# Patient Record
Sex: Male | Born: 1975
Health system: Southern US, Community
[De-identification: ages and names within clinical notes are randomized; demographics above are authoritative.]

## PROBLEM LIST (undated history)

## (undated) ENCOUNTER — Ambulatory Visit (HOSPITAL_COMMUNITY): Admission: EM | Payer: 59 | Source: Home / Self Care

## (undated) HISTORY — PX: MOUTH SURGERY: SHX715

---
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
## 1999-09-22 ENCOUNTER — Emergency Department (HOSPITAL_COMMUNITY): Admission: EM | Admit: 1999-09-22 | Discharge: 1999-09-22 | Payer: Self-pay | Admitting: Emergency Medicine

## 2002-04-24 ENCOUNTER — Encounter: Payer: Self-pay | Admitting: Emergency Medicine

## 2002-04-24 ENCOUNTER — Emergency Department (HOSPITAL_COMMUNITY): Admission: EM | Admit: 2002-04-24 | Discharge: 2002-04-24 | Payer: Self-pay | Admitting: Emergency Medicine

## 2002-07-17 ENCOUNTER — Emergency Department (HOSPITAL_COMMUNITY): Admission: EM | Admit: 2002-07-17 | Discharge: 2002-07-17 | Payer: Self-pay | Admitting: Emergency Medicine

## 2005-06-14 ENCOUNTER — Emergency Department (HOSPITAL_COMMUNITY): Admission: EM | Admit: 2005-06-14 | Discharge: 2005-06-14 | Payer: Self-pay | Admitting: Family Medicine

## 2007-12-11 ENCOUNTER — Emergency Department (HOSPITAL_COMMUNITY): Admission: EM | Admit: 2007-12-11 | Discharge: 2007-12-11 | Payer: Self-pay | Admitting: Emergency Medicine

## 2008-08-10 ENCOUNTER — Emergency Department (HOSPITAL_COMMUNITY): Admission: EM | Admit: 2008-08-10 | Discharge: 2008-08-10 | Payer: Self-pay | Admitting: Family Medicine

## 2009-05-05 ENCOUNTER — Emergency Department (HOSPITAL_COMMUNITY): Admission: EM | Admit: 2009-05-05 | Discharge: 2009-05-05 | Payer: Self-pay | Admitting: Family Medicine

## 2009-12-01 ENCOUNTER — Emergency Department (HOSPITAL_COMMUNITY): Admission: EM | Admit: 2009-12-01 | Discharge: 2009-12-01 | Payer: Self-pay | Admitting: Family Medicine

## 2010-03-31 ENCOUNTER — Emergency Department (HOSPITAL_COMMUNITY)
Admission: EM | Admit: 2010-03-31 | Discharge: 2010-03-31 | Payer: Self-pay | Source: Home / Self Care | Admitting: Family Medicine

## 2010-06-21 LAB — GIARDIA/CRYPTOSPORIDIUM SCREEN(EIA)
Cryptosporidium Screen (EIA): NEGATIVE
Giardia Screen - EIA: NEGATIVE

## 2010-07-11 LAB — GC/CHLAMYDIA PROBE AMP, GENITAL
Chlamydia, DNA Probe: NEGATIVE
GC Probe Amp, Genital: NEGATIVE

## 2011-01-03 LAB — GC/CHLAMYDIA PROBE AMP, GENITAL
Chlamydia, DNA Probe: NEGATIVE
GC Probe Amp, Genital: NEGATIVE

## 2011-06-22 ENCOUNTER — Emergency Department (HOSPITAL_COMMUNITY)
Admission: EM | Admit: 2011-06-22 | Discharge: 2011-06-22 | Disposition: A | Discharge status: Home / Self Care | Payer: Self-pay | Source: Home / Self Care | Attending: Emergency Medicine | Admitting: Emergency Medicine

## 2011-06-22 ENCOUNTER — Encounter (HOSPITAL_COMMUNITY): Payer: Self-pay

## 2011-06-22 DIAGNOSIS — IMO0002 Reserved for concepts with insufficient information to code with codable children: Secondary | ICD-10-CM

## 2011-06-22 MED ORDER — DOXYCYCLINE HYCLATE 100 MG PO CAPS: 100.0000 mg | ORAL_CAPSULE | Freq: Two times a day (BID) | ORAL | Status: AC

## 2011-06-22 NOTE — ED Provider Notes (Signed)
History     CSN: [id number]  Arrival date & time 06/22/11  1328   First MD Initiated Contact with Patient 06/22/11 1401      Chief Complaint  Patient presents with  . Toe Pain    (Consider location/radiation/quality/duration/timing/severity/associated sxs/prior treatment) HPI Comments: Patient presents urgent care with infection to both great toenails that have been getting worse in the last 2 days he relates that he cut his great toenails about 3 days ago sustaining clean warm ascites and clean it and debriding the area with pressors. Have been soaking his feet in Epsom salts frequently in both areas on the skin of both hands are swollen red and draining  Patient is a 36 y.o. male presenting with toe pain. The history is provided by the patient.  Toe Pain This is a new problem. The current episode started more than 2 days ago. The problem occurs constantly. The symptoms are aggravated by bending and walking. The symptoms are relieved by rest. He has tried nothing for the symptoms.    History reviewed. No pertinent past medical history.  History reviewed. No pertinent past surgical history.  No family history on file.  History  Substance Use Topics  . Smoking status: Current Everyday Smoker -- 0.5 packs/day  . Smokeless tobacco: Not on file  . Alcohol Use: Yes      Review of Systems  Constitutional: Negative for fever, chills and fatigue.  Skin: Positive for rash and wound.    Allergies  Review of patient's allergies indicates no known allergies.  Home Medications   Current Outpatient Rx  Name Route Sig Dispense Refill  . DOXYCYCLINE HYCLATE 100 MG PO CAPS Oral Take 1 capsule (100 mg total) by mouth 2 (two) times daily. 20 capsule 0    BP 128/81  Pulse 88  Temp(Src) 98.9 F (37.2 C) (Oral)  Resp 18  SpO2 96%  Physical Exam  Nursing note and vitals reviewed. Constitutional: He appears well-developed and well-nourished.  Musculoskeletal: He exhibits edema  and tenderness.       Feet:  Skin: Skin is warm. There is erythema.    ED Course  Procedures (including critical care time)  Labs Reviewed - No data to display No results found.   1. Paronychia       MDM  Posttraumatic bilateral great toe paronychia is. Spontaneously draining        Jimmie Molly, MD 06/22/11 [phone]

## 2011-06-22 NOTE — ED Notes (Signed)
Pt states he cut his great toenails 3 days ago and then cleaned around/under them with tweezers.  States now the skin around both nails are swollen, red, draining and sore.

## 2012-02-14 ENCOUNTER — Telehealth: Payer: Self-pay | Admitting: Nurse Practitioner

## 2012-02-14 NOTE — Telephone Encounter (Signed)
Error

## 2012-04-02 ENCOUNTER — Emergency Department (HOSPITAL_COMMUNITY)
Admission: EM | Admit: 2012-04-02 | Discharge: 2012-04-02 | Disposition: A | Discharge status: Home / Self Care | Payer: 59 | Source: Home / Self Care | Attending: Family Medicine | Admitting: Family Medicine

## 2012-04-02 ENCOUNTER — Encounter (HOSPITAL_COMMUNITY): Payer: Self-pay | Admitting: *Deleted

## 2012-04-02 DIAGNOSIS — J069 Acute upper respiratory infection, unspecified: Secondary | ICD-10-CM

## 2012-04-02 LAB — POCT RAPID STREP A: Streptococcus, Group A Screen (Direct): NEGATIVE

## 2012-04-02 NOTE — ED Notes (Signed)
Pt  Had  A  Fever  sev  Days  Ago  He  Reports   Body  Aches   And      Low  Grade  Fever  As  Well         At this  Time  Pt is  Sitting  Upright on  Exam table   Speaking in  Complete  sentances      Skin is  Warm and  dry

## 2012-04-02 NOTE — ED Provider Notes (Signed)
History     CSN: [id number]  Arrival date & time 04/02/12  1134   First MD Initiated Contact with Patient 04/02/12 1214      Chief Complaint  Patient presents with  . Sore Throat    (Consider location/radiation/quality/duration/timing/severity/associated sxs/prior treatment) Patient is a 37 y.o. male presenting with pharyngitis. The history is provided by the patient.  Sore Throat This is a new problem. The current episode started more than 2 days ago. The problem has not changed since onset.Pertinent negatives include no shortness of breath. The symptoms are aggravated by swallowing.    History reviewed. No pertinent past medical history.  History reviewed. No pertinent past surgical history.  No family history on file.  History  Substance Use Topics  . Smoking status: Current Every Day Smoker -- 0.5 packs/day  . Smokeless tobacco: Not on file  . Alcohol Use: Yes      Review of Systems  Constitutional: Positive for fever. Negative for chills and appetite change.  HENT: Positive for sore throat. Negative for congestion, rhinorrhea and postnasal drip.   Respiratory: Negative for cough, shortness of breath and wheezing.   Gastrointestinal: Negative.   Skin: Negative for rash.    Allergies  Review of patient's allergies indicates no known allergies.  Home Medications  No current outpatient prescriptions on file.  BP 122/80  Pulse 72  Temp 99.4 F (37.4 C) (Oral)  Resp 18  SpO2 100%  Physical Exam  Nursing note and vitals reviewed. Constitutional: He is oriented to person, place, and time. He appears well-developed and well-nourished.  HENT:  Head: Normocephalic.  Right Ear: External ear normal.  Left Ear: External ear normal.  Mouth/Throat: Oropharynx is clear and moist.  Eyes: Conjunctivae normal are normal. Pupils are equal, round, and reactive to light.  Neck: Normal range of motion. Neck supple.  Cardiovascular: Normal rate, normal heart sounds and  intact distal pulses.   Pulmonary/Chest: Effort normal and breath sounds normal.  Abdominal: Soft. Bowel sounds are normal.  Lymphadenopathy:    He has no cervical adenopathy.  Neurological: He is alert and oriented to person, place, and time.  Skin: Skin is warm and dry.    ED Course  Procedures (including critical care time)   Labs Reviewed  POCT RAPID STREP A ([facility] ONLY)   No results found.   1. URI (upper respiratory infection)       MDM  Strep neg.        Linna Hoff, MD 04/02/12 1239

## 2014-03-05 ENCOUNTER — Ambulatory Visit (INDEPENDENT_AMBULATORY_CARE_PROVIDER_SITE_OTHER): Payer: 59 | Admitting: Family

## 2014-03-05 ENCOUNTER — Encounter: Payer: Self-pay | Admitting: Family

## 2014-03-05 DIAGNOSIS — Z0000 Encounter for general adult medical examination without abnormal findings: Secondary | ICD-10-CM | POA: Insufficient documentation

## 2014-03-05 NOTE — Progress Notes (Signed)
Subjective:    Patient ID: Casey Sellers, male    DOB: 09/24/1975, 38 y.o.   MRN: [id number]  Chief Complaint  Patient presents with  . Establish Care    wants to make sure hes in good health   HPI:  Casey Sellers is a 38 y.o. male who presents today for an annual wellness visit and to establish care.    1) Health Maintenance -   Diet - Does not eat a lot of fried foods. Bakes food.  Exercise - Getting ready to start  2) Preventative Exams / Immunizations:  Dental -- Due for exam Vision -- Due for exam   Health Maintenance  Topic Date Due  . INFLUENZA VACCINE  11/01/2014  . TETANUS/TDAP  12/31/2020    There is no immunization history on file for this patient.  No past medical history on file.  Past Surgical History  Procedure Laterality Date  . Knee surgery  1995    Cartiledge repair   Family History  Problem Relation Age of Onset  . Hypertension Mother   . Diabetes Mother   . Diabetes Father    History   Social History  . Marital Status: Divorced    Spouse Name: N/A    Number of Children: 3  . Years of Education: 12   Occupational History  . [employer]    Social History Main Topics  . Smoking status: Current Every Day Smoker -- 0.50 packs/day for 25 years  . Smokeless tobacco: Never Used  . Alcohol Use: Yes     Comment: 1/2 pint a day  . Drug Use: No  . Sexual Activity: Not on file   Other Topics Concern  . Not on file   Social History Narrative   [place] Currently resides in a house with his son, finace and step-son. Fish. Fun: hang out at the house.   Denies any religious beliefs that would effect health care.     Review of Systems  Constitutional: Denies fever, chills, fatigue, or significant weight gain/loss. HENT: Head: Denies headache or neck pain Ears: Denies changes in hearing, ringing in ears, earache, drainage Nose: Denies discharge, stuffiness, itching, nosebleed, sinus pain Throat:  Denies sore throat, hoarseness, dry mouth, sores, thrush Eyes: Denies loss/changes in vision, pain, redness, blurry/double vision, flashing lights Cardiovascular: Denies chest pain/discomfort, tightness, palpitations, shortness of breath with activity, difficulty lying down, swelling, sudden awakening with shortness of breath Respiratory: Denies shortness of breath, cough, sputum production, wheezing Gastrointestinal: Denies dysphasia, heartburn, change in appetite, nausea, change in bowel habits, rectal bleeding, constipation, diarrhea, yellow skin or eyes Genitourinary: Denies frequency, urgency, burning/pain, blood in urine, incontinence, change in urinary strength. Musculoskeletal: Denies muscle/joint pain, stiffness, back pain, redness or swelling of joints, trauma Skin: Denies rashes, lumps, itching, dryness, color changes, or hair/nail changes Neurological: Denies dizziness, fainting, seizures, weakness, numbness, tingling, tremor Psychiatric - Denies nervousness, stress, depression or memory loss Endocrine: Denies heat or cold intolerance, sweating, frequent urination, excessive thirst, changes in appetite Hematologic: Denies ease of bruising or bleeding    Objective:    BP 148/80 mmHg  Pulse 96  Temp(Src) 98.3 F (36.8 C) (Oral)  Resp 18  Ht 5\' 5"  (1.651 m)  Wt 159 lb 6.4 oz (72.303 kg)  BMI 26.53 kg/m2  SpO2 97% Nursing note and vital signs reviewed.  Physical Exam  Constitutional: He is oriented to person, place, and time. He appears well-developed and well-nourished.  HENT:  Head: Normocephalic.  Right Ear: Hearing, tympanic membrane, external ear and ear canal normal.  Left Ear: Hearing, tympanic membrane, external ear and ear canal normal.  Nose: Nose normal.  Mouth/Throat: Uvula is midline, oropharynx is clear and moist and mucous membranes are normal.  Eyes: Conjunctivae and EOM are normal. Pupils are equal, round, and reactive to light.  Neck: Neck supple. No JVD  present. No tracheal deviation present. No thyromegaly present.  Cardiovascular: Normal rate, regular rhythm, normal heart sounds and intact distal pulses.   Pulmonary/Chest: Effort normal and breath sounds normal.  Abdominal: Soft. Bowel sounds are normal. He exhibits no distension and no mass. There is no tenderness. There is no rebound and no guarding.  Musculoskeletal: Normal range of motion. He exhibits no edema or tenderness.  Lymphadenopathy:    He has no cervical adenopathy.  Neurological: He is alert and oriented to person, place, and time. He has normal reflexes. No cranial nerve deficit. He exhibits normal muscle tone. Coordination normal.  Skin: Skin is warm and dry.  Psychiatric: He has a normal mood and affect. His behavior is normal. Judgment and thought content normal.       Assessment & Plan:

## 2014-03-05 NOTE — Assessment & Plan Note (Signed)
1) Anticipatory Guidance: Discussed importance of wearing a seatbelt while driving and not texting while driving; changing batteries in smoke detector at least once annually; wearing suntan lotion when outside; eating a balanced and moderate diet; getting physical activity at least 30 minutes per day.  2) Immunizations / Screenings / Labs:  All immunizations are up-to-date. He is due for both dental and visual screens. All other screenings are up-to-date. Obtain CBC, BMET, Lipid profile and TSH when fasting.  Over all well exam. Has several risk factors including smoking and alcohol use. Discuss decreasing both smoking and alcohol use. He will be starting an exercise program. Follow up prevention exam in one year or sooner pending lab work.

## 2014-03-05 NOTE — Patient Instructions (Signed)
Thank you for choosing [facility].  Summary/Instructions:  Please stop by the lab on the basement level of the building for your blood work. Your results will be released to MyChart (or called to you) after review, usually within 72 hours after test completion. If any changes need to be made, you will be notified at that same time.  If your symptoms worsen or fail to improve, please contact our office for further instruction, or in case of emergency go directly to the emergency room at the closest medical facility.   Health Maintenance A healthy lifestyle and preventative care can promote health and wellness.  Maintain regular health, dental, and eye exams.  Eat a healthy diet. Foods like vegetables, fruits, whole grains, low-fat dairy products, and lean protein foods contain the nutrients you need and are low in calories. Decrease your intake of foods high in solid fats, added sugars, and salt. Get information about a proper diet from your health care provider, if necessary.  Regular physical exercise is one of the most important things you can do for your health. Most adults should get at least 150 minutes of moderate-intensity exercise (any activity that increases your heart rate and causes you to sweat) each week. In addition, most adults need muscle-strengthening exercises on 2 or more days a week.   Maintain a healthy weight. The body mass index (BMI) is a screening tool to identify possible weight problems. It provides an estimate of body fat based on height and weight. Your health care provider can find your BMI and can help you achieve or maintain a healthy weight. For males 20 years and older:  A BMI below 18.5 is considered underweight.  A BMI of 18.5 to 24.9 is normal.  A BMI of 25 to 29.9 is considered overweight.  A BMI of 30 and above is considered obese.  Maintain normal blood lipids and cholesterol by exercising and minimizing your intake of saturated fat. Eat a  balanced diet with plenty of fruits and vegetables. Blood tests for lipids and cholesterol should begin at age 20 and be repeated every 5 years. If your lipid or cholesterol levels are high, you are over age 50, or you are at high risk for heart disease, you may need your cholesterol levels checked more frequently.Ongoing high lipid and cholesterol levels should be treated with medicines if diet and exercise are not working.  If you smoke, find out from your health care provider how to quit. If you do not use tobacco, do not start.  Lung cancer screening is recommended for adults aged 55-80 years who are at high risk for developing lung cancer because of a history of smoking. A yearly low-dose CT scan of the lungs is recommended for people who have at least a 30-pack-year history of smoking and are current smokers or have quit within the past 15 years. A pack year of smoking is smoking an average of 1 pack of cigarettes a day for 1 year (for example, a 30-pack-year history of smoking could mean smoking 1 pack a day for 30 years or 2 packs a day for 15 years). Yearly screening should continue until the smoker has stopped smoking for at least 15 years. Yearly screening should be stopped for people who develop a health problem that would prevent them from having lung cancer treatment.  If you choose to drink alcohol, do not have more than 2 drinks per day. One drink is considered to be 12 oz (360 mL) of beer,   5 oz (150 mL) of wine, or 1.5 oz (45 mL) of liquor.  Avoid the use of street drugs. Do not share needles with anyone. Ask for help if you need support or instructions about stopping the use of drugs.  High blood pressure causes heart disease and increases the risk of stroke. Blood pressure should be checked at least every 1-2 years. Ongoing high blood pressure should be treated with medicines if weight loss and exercise are not effective.  If you are 45-79 years old, ask your health care provider if  you should take aspirin to prevent heart disease.  Diabetes screening involves taking a blood sample to check your fasting blood sugar level. This should be done once every 3 years after age 45 if you are at a normal weight and without risk factors for diabetes. Testing should be considered at a younger age or be carried out more frequently if you are overweight and have at least 1 risk factor for diabetes.  Colorectal cancer can be detected and often prevented. Most routine colorectal cancer screening begins at the age of 50 and continues through age 75. However, your health care provider may recommend screening at an earlier age if you have risk factors for colon cancer. On a yearly basis, your health care provider may provide home test kits to check for hidden blood in the stool. A small camera at the end of a tube may be used to directly examine the colon (sigmoidoscopy or colonoscopy) to detect the earliest forms of colorectal cancer. Talk to your health care provider about this at age 50 when routine screening begins. A direct exam of the colon should be repeated every 5-10 years through age 75, unless early forms of precancerous polyps or small growths are found.  People who are at an increased risk for hepatitis B should be screened for this virus. You are considered at high risk for hepatitis B if:  You were born in a country where hepatitis B occurs often. Talk with your health care provider about which countries are considered high risk.  Your parents were born in a high-risk country and you have not received a shot to protect against hepatitis B (hepatitis B vaccine).  You have HIV or AIDS.  You use needles to inject street drugs.  You live with, or have sex with, someone who has hepatitis B.  You are a man who has sex with other men (MSM).  You get hemodialysis treatment.  You take certain medicines for conditions like cancer, organ transplantation, and autoimmune  conditions.  Hepatitis C blood testing is recommended for all people born from 1945 through 1965 and any individual with known risk factors for hepatitis C.  Healthy men should no longer receive prostate-specific antigen (PSA) blood tests as part of routine cancer screening. Talk to your health care provider about prostate cancer screening.  Testicular cancer screening is not recommended for adolescents or adult males who have no symptoms. Screening includes self-exam, a health care provider exam, and other screening tests. Consult with your health care provider about any symptoms you have or any concerns you have about testicular cancer.  Practice safe sex. Use condoms and avoid high-risk sexual practices to reduce the spread of sexually transmitted infections (STIs).  You should be screened for STIs, including gonorrhea and chlamydia if:  You are sexually active and are younger than 24 years.  You are older than 24 years, and your health care provider tells you that you are at   risk for this type of infection.  Your sexual activity has changed since you were last screened, and you are at an increased risk for chlamydia or gonorrhea. Ask your health care provider if you are at risk.  If you are at risk of being infected with HIV, it is recommended that you take a prescription medicine daily to prevent HIV infection. This is called pre-exposure prophylaxis (PrEP). You are considered at risk if:  You are a man who has sex with other men (MSM).  You are a heterosexual man who is sexually active with multiple partners.  You take drugs by injection.  You are sexually active with a partner who has HIV.  Talk with your health care provider about whether you are at high risk of being infected with HIV. If you choose to begin PrEP, you should first be tested for HIV. You should then be tested every 3 months for as long as you are taking PrEP.  Use sunscreen. Apply sunscreen liberally and  repeatedly throughout the day. You should seek shade when your shadow is shorter than you. Protect yourself by wearing long sleeves, pants, a wide-brimmed hat, and sunglasses year round whenever you are outdoors.  Tell your health care provider of new moles or changes in moles, especially if there is a change in shape or color. Also, tell your health care provider if a mole is larger than the size of a pencil eraser.  A one-time screening for abdominal aortic aneurysm (AAA) and surgical repair of large AAAs by ultrasound is recommended for men aged 65-75 years who are current or former smokers.  Stay current with your vaccines (immunizations). Document Released: 09/15/2007 Document Revised: 03/24/2013 Document Reviewed: 08/14/2010 Exit[employer] Patient Information 2015 [employer]. This information is not intended to replace advice given to you by your health care provider. Make sure you discuss any questions you have with your health care provider.  

## 2014-03-05 NOTE — Progress Notes (Signed)
Pre visit review using our clinic review tool, if applicable. No additional management support is needed unless otherwise documented below in the visit note. 

## 2014-03-08 ENCOUNTER — Telehealth: Payer: Self-pay | Admitting: Family

## 2014-03-08 ENCOUNTER — Encounter: Payer: Self-pay | Admitting: Family

## 2014-03-08 ENCOUNTER — Other Ambulatory Visit (INDEPENDENT_AMBULATORY_CARE_PROVIDER_SITE_OTHER): Payer: 59

## 2014-03-08 DIAGNOSIS — Z0000 Encounter for general adult medical examination without abnormal findings: Secondary | ICD-10-CM

## 2014-03-08 DIAGNOSIS — R790 Abnormal level of blood mineral: Secondary | ICD-10-CM

## 2014-03-08 LAB — CBC
HEMATOCRIT: 44.7 % (ref 39.0–52.0)
HEMOGLOBIN: 15.1 g/dL (ref 13.0–17.0)
MCHC: 33.7 g/dL (ref 30.0–36.0)
MCV: 96.3 fl (ref 78.0–100.0)
Platelets: 231 10*3/uL (ref 150.0–400.0)
RBC: 4.64 Mil/uL (ref 4.22–5.81)
RDW: 13.1 % (ref 11.5–15.5)
WBC: 7.6 10*3/uL (ref 4.0–10.5)

## 2014-03-08 LAB — LIPID PANEL
CHOL/HDL RATIO: 3
Cholesterol: 196 mg/dL (ref 0–200)
HDL: 57.5 mg/dL (ref >39.00)
NonHDL: 138.5
TRIGLYCERIDES: 257 mg/dL — AB (ref 0.0–149.0)
VLDL: 51.4 mg/dL — AB (ref 0.0–40.0)

## 2014-03-08 LAB — BASIC METABOLIC PANEL
BUN: 11 mg/dL (ref 6–23)
CO2: 25 mEq/L (ref 19–32)
CREATININE: 0.8 mg/dL (ref 0.4–1.5)
Calcium: 9.5 mg/dL (ref 8.4–10.5)
Chloride: 107 mEq/L (ref 96–112)
GFR: 142.2 mL/min (ref >60.00)
GLUCOSE: 101 mg/dL — AB (ref 70–99)
Potassium: 3.9 mEq/L (ref 3.5–5.1)
SODIUM: 139 meq/L (ref 135–145)

## 2014-03-08 LAB — LDL CHOLESTEROL, DIRECT: Direct LDL: 85.3 mg/dL

## 2014-03-08 LAB — TSH: TSH: 1.23 u[IU]/mL (ref 0.35–4.50)

## 2014-03-08 NOTE — Telephone Encounter (Signed)
Pt called in and is requesting a call back with lab result  Best number [phone]

## 2014-03-08 NOTE — Telephone Encounter (Signed)
emmi mailed  °

## 2014-03-09 ENCOUNTER — Telehealth: Payer: Self-pay | Admitting: Family

## 2014-03-09 NOTE — Telephone Encounter (Signed)
Pt aware that labs were good with the exception of tryglicerides being a little high. Encouraged him to increase fruit and vegetable intake while working out most days of the week. Pt understood.

## 2014-03-09 NOTE — Telephone Encounter (Signed)
Results were sent through MyChart:  Your lab results have returned and are mainly within the expected range is. Your triglyceride level, or the fats in your blood is slightly elevated. This can be managed with diet and exercise. Increase her fruits and vegetables while decreasing her saturated fats. Increasing physical activity to 30 minutes most days of the week and also have a positive effect.   Please let me know if you have any questions.

## 2014-03-09 NOTE — Telephone Encounter (Signed)
Tried calling pt with lab results. No answer. Left VM for him to call back.

## 2014-06-15 ENCOUNTER — Ambulatory Visit: Payer: 59 | Admitting: Family

## 2014-06-16 ENCOUNTER — Encounter: Payer: Self-pay | Admitting: Family

## 2014-06-16 ENCOUNTER — Ambulatory Visit (INDEPENDENT_AMBULATORY_CARE_PROVIDER_SITE_OTHER): Payer: 59 | Admitting: Family

## 2014-06-16 VITALS — BP 160/98 | HR 97 | Temp 98.0°F | Resp 18 | Ht 65.0 in | Wt 162.8 lb

## 2014-06-16 DIAGNOSIS — R051 Acute cough: Secondary | ICD-10-CM | POA: Insufficient documentation

## 2014-06-16 DIAGNOSIS — R059 Cough, unspecified: Secondary | ICD-10-CM | POA: Insufficient documentation

## 2014-06-16 DIAGNOSIS — R05 Cough: Secondary | ICD-10-CM

## 2014-06-16 MED ORDER — HYDROCODONE-HOMATROPINE 5-1.5 MG/5ML PO SYRP: 5.0000 mL | ORAL_SOLUTION | Freq: Three times a day (TID) | ORAL | Status: DC | PRN

## 2014-06-16 MED ORDER — LEVOFLOXACIN 500 MG PO TABS: 500.0000 mg | ORAL_TABLET | Freq: Every day | ORAL | Status: DC

## 2014-06-16 NOTE — Progress Notes (Signed)
   Subjective:    Patient ID: Casey Sellers Lehmkuhl, male    DOB: 09/09/1975, 39 y.o.   MRN: [id number]  Chief Complaint  Patient presents with  . Cough    x2 weeks, congestion, productive cough, chest and stomach hurts from coughing so much, having nose bleeds, did have fever, chills, and body aches last week    HPI:  Casey Sellers Penson is a 39 y.o. male who presents today for an acute visit.   This is a new problem. Associated symptoms of cough, congestion, chest and stomach discomfort, and nose bleeds. Notes last week he did have fever, chills, and body ache. Has tried Mucinex and Nyquil which have provided minimal relief. Denies any recent antibiotic use.    No Known Allergies   No current outpatient prescriptions on file prior to visit.   No current facility-administered medications on file prior to visit.    Review of Systems  Constitutional: Positive for fever. Negative for chills.  HENT: Positive for congestion and sinus pressure. Negative for sore throat.   Respiratory: Positive for cough. Negative for choking and shortness of breath.   Neurological: Positive for headaches.      Objective:    BP 160/98 mmHg  Pulse 97  Temp(Src) 98 F (36.7 C) (Oral)  Resp 18  Ht 5\' 5"  (1.651 m)  Wt 162 lb 12.8 oz (73.846 kg)  BMI 27.09 kg/m2  SpO2 97% Nursing note and vital signs reviewed.  Physical Exam  Constitutional: He is oriented to person, place, and time. He appears well-developed and well-nourished. No distress.  HENT:  Right Ear: Hearing, tympanic membrane, external ear and ear canal normal.  Left Ear: Hearing, tympanic membrane, external ear and ear canal normal.  Nose: Right sinus exhibits maxillary sinus tenderness. Right sinus exhibits no frontal sinus tenderness. Left sinus exhibits maxillary sinus tenderness. Left sinus exhibits no frontal sinus tenderness.  Mouth/Throat: Uvula is midline, oropharynx is clear and moist and mucous membranes are normal.    Cardiovascular: Normal rate, regular rhythm, normal heart sounds and intact distal pulses.   Pulmonary/Chest: Effort normal and breath sounds normal.  Neurological: He is alert and oriented to person, place, and time.  Skin: Skin is warm and dry.  Psychiatric: He has a normal mood and affect. His behavior is normal. Judgment and thought content normal.       Assessment & Plan:

## 2014-06-16 NOTE — Patient Instructions (Signed)
Thank you for choosing [facility].  Summary/Instructions:  Your prescription(s) have been submitted to your pharmacy or been printed and provided for you. Please take as directed and contact our office if you believe you are having problem(s) with the medication(s) or have any questions.  If your symptoms worsen or fail to improve, please contact our office for further instruction, or in case of emergency go directly to the emergency room at the closest medical facility.   General Recommendations:    Please drink plenty of fluids.  Get plenty of rest   Sleep in humidified air  Use saline nasal sprays  Netti pot   OTC Medications:  Decongestants - helps relieve congestion   Flonase (generic fluticasone) or Nasacort (generic triamcinolone) - please make sure to use the "cross-over" technique at a 45 degree angle towards the opposite eye as opposed to straight up the nasal passageway.   Sudafed (generic pseudoephedrine - Note this is the one that is available behind the pharmacy counter); Products with phenylephrine (-PE) may also be used but is often not as effective as pseudoephedrine.   If you have HIGH BLOOD PRESSURE - Coricidin HBP; AVOID any product that is -D as this contains pseudoephedrine which may increase your blood pressure.  Afrin (oxymetazoline) every 6-8 hours for up to 3 days.   Allergies - helps relieve runny nose, itchy eyes and sneezing   Claritin (generic loratidine), Allegra (fexofenidine), or Zyrtec (generic cyrterizine) for runny nose. These medications should not cause drowsiness.  Note - Benadryl (generic diphenhydramine) may be used however may cause drowsiness  Cough -   Delsym or Robitussin (generic dextromethorphan)  Expectorants - helps loosen mucus to ease removal   Mucinex (generic guaifenesin) as directed on the package.  Headaches / General Aches   Tylenol (generic acetaminophen) - DO NOT EXCEED 3 grams (3,000 mg) in a 24  hour time period  Advil/Motrin (generic ibuprofen)   Sore Throat -   Salt water gargle   Chloraseptic (generic benzocaine) spray or lozenges / Sucrets (generic dyclonine)      

## 2014-06-16 NOTE — Progress Notes (Signed)
Pre visit review using our clinic review tool, if applicable. No additional management support is needed unless otherwise documented below in the visit note. 

## 2014-06-16 NOTE — Assessment & Plan Note (Signed)
Symptoms and exam consistent with upper respiratory infection and possible bronchitis. Start Levophed levofloxacin. Start Hycodan as needed for cough and sleep. Continue over-the-counter medications as needed for symptom relief. Follow-up if symptoms worsen or fail to improve.

## 2015-03-07 ENCOUNTER — Ambulatory Visit (INDEPENDENT_AMBULATORY_CARE_PROVIDER_SITE_OTHER): Payer: 59 | Admitting: Family

## 2015-03-07 ENCOUNTER — Encounter: Payer: Self-pay | Admitting: Family

## 2015-03-07 VITALS — BP 136/88 | HR 95 | Temp 98.2°F | Resp 18 | Ht 65.0 in | Wt 167.0 lb

## 2015-03-07 DIAGNOSIS — R05 Cough: Secondary | ICD-10-CM | POA: Diagnosis not present

## 2015-03-07 DIAGNOSIS — R1030 Lower abdominal pain, unspecified: Secondary | ICD-10-CM | POA: Diagnosis not present

## 2015-03-07 DIAGNOSIS — R059 Cough, unspecified: Secondary | ICD-10-CM

## 2015-03-07 DIAGNOSIS — R1031 Right lower quadrant pain: Secondary | ICD-10-CM

## 2015-03-07 MED ORDER — CEFUROXIME AXETIL 500 MG PO TABS: 500.0000 mg | ORAL_TABLET | Freq: Two times a day (BID) | ORAL | Status: DC

## 2015-03-07 MED ORDER — HYDROCODONE-HOMATROPINE 5-1.5 MG/5ML PO SYRP: 5.0000 mL | ORAL_SOLUTION | Freq: Three times a day (TID) | ORAL | Status: DC | PRN

## 2015-03-07 NOTE — Progress Notes (Signed)
Pre visit review using our clinic review tool, if applicable. No additional management support is needed unless otherwise documented below in the visit note. 

## 2015-03-07 NOTE — Progress Notes (Signed)
Subjective:    Patient ID: Casey Sellers Trigg, male    DOB: 11/22/1975, 39 y.o.   MRN: [id number]  Chief Complaint  Patient presents with  . Nasal Congestion    x1 week, congestion, ear pain with swallowing, headache, sore throat, fever, body aches    HPI:  Casey Sellers Ehlert is a 39 y.o. male who  has no past medical history on file. and presents today for an acute office visit.  1.) Nasal congestion - This is a new problem. Associated symptoms of congestion, ear pain with swallowing, headache, sore throat, fever, and body aches have been going on for approximately one week. Last fever was yesterday. Modifying factors include netti pot and OTC medicaitons that have not provided any relief. Denies recent antibiotic use.   2.) Groin pain -  This is a new problem. Associated symptom of popping located in his right groin has been going on for a couple of weeks. Describes there is some weakness on occasion. Denies any trauma to the area. Modifying factors include stretching which does not really help. Pain is described as shooting pain.    No Known Allergies   No current outpatient prescriptions on file prior to visit.   No current facility-administered medications on file prior to visit.    No past medical history on file.   Review of Systems  Constitutional: Positive for fever. Negative for chills.  HENT: Positive for congestion, ear pain and sore throat.   Respiratory: Positive for choking.   Musculoskeletal:       Positive for right groin pain.  Neurological: Positive for headaches.      Objective:    BP 136/88 mmHg  Pulse 95  Temp(Src) 98.2 F (36.8 C) (Oral)  Resp 18  Ht 5\' 5"  (1.651 m)  Wt 167 lb (75.751 kg)  BMI 27.79 kg/m2  SpO2 97% Nursing note and vital signs reviewed.  Physical Exam  Constitutional: He is oriented to person, place, and time. He appears well-developed and well-nourished. No distress.  HENT:  Right Ear: Hearing, tympanic membrane, external  ear and ear canal normal.  Left Ear: Hearing, tympanic membrane, external ear and ear canal normal.  Nose: Nose normal. Right sinus exhibits no maxillary sinus tenderness and no frontal sinus tenderness. Left sinus exhibits no maxillary sinus tenderness and no frontal sinus tenderness.  Mouth/Throat: Uvula is midline, oropharynx is clear and moist and mucous membranes are normal.  Cardiovascular: Normal rate, regular rhythm, normal heart sounds and intact distal pulses.   Pulmonary/Chest: Effort normal and breath sounds normal.  Musculoskeletal:  Right groin - No obvious deformity, discoloration or edema. Mild tenderness of adductor origin. Range of motion and strength are normal. Distal pulses and sensation are intact and appropriate. Negative hip scouring. Negative FABERs.   Neurological: He is alert and oriented to person, place, and time.  Skin: Skin is warm and dry.  Psychiatric: He has a normal mood and affect. His behavior is normal. Judgment and thought content normal.       Assessment & Plan:   Problem List Items Addressed This Visit      Other   Cough - Primary    Symptoms and exam consistent with acute upper respiratory infection. Start Ceftin. Start Hycodan as needed for cough and sleep. Continue over the counter medications as needed for symptom relief and supportive care. Follow up if symptoms worsen or fail to improve.       Relevant Medications   HYDROcodone-homatropine (HYCODAN) 5-1.5 MG/5ML  syrup   cefUROXime (CEFTIN) 500 MG tablet   Right groin pain    Right groin pain consistent with mild-moderate groin strain. Treat conservatively with over the counter medications as needed for symptom relief and supportive care. Start home exercise therapy. Follow up if symptoms worsen or fail to improve for further management or imaging.

## 2015-03-07 NOTE — Patient Instructions (Addendum)
Thank you for choosing [facility].  Summary/Instructions:  Your prescription(s) have been submitted to your pharmacy or been printed and provided for you. Please take as directed and contact our office if you believe you are having problem(s) with the medication(s) or have any questions.  If your symptoms worsen or fail to improve, please contact our office for further instruction, or in case of emergency go directly to the emergency room at the closest medical facility.   General Recommendations:    Please drink plenty of fluids.  Get plenty of rest   Sleep in humidified air  Use saline nasal sprays  Netti pot   OTC Medications:  Decongestants - helps relieve congestion   Flonase (generic fluticasone) or Nasacort (generic triamcinolone) - please make sure to use the "cross-over" technique at a 45 degree angle towards the opposite eye as opposed to straight up the nasal passageway.   Sudafed (generic pseudoephedrine - Note this is the one that is available behind the pharmacy counter); Products with phenylephrine (-PE) may also be used but is often not as effective as pseudoephedrine.   If you have HIGH BLOOD PRESSURE - Coricidin HBP; AVOID any product that is -D as this contains pseudoephedrine which may increase your blood pressure.  Afrin (oxymetazoline) every 6-8 hours for up to 3 days.   Allergies - helps relieve runny nose, itchy eyes and sneezing   Claritin (generic loratidine), Allegra (fexofenidine), or Zyrtec (generic cyrterizine) for runny nose. These medications should not cause drowsiness.  Note - Benadryl (generic diphenhydramine) may be used however may cause drowsiness  Cough -   Delsym or Robitussin (generic dextromethorphan)  Expectorants - helps loosen mucus to ease removal   Mucinex (generic guaifenesin) as directed on the package.  Headaches / General Aches   Tylenol (generic acetaminophen) - DO NOT EXCEED 3 grams (3,000 mg) in a 24  hour time period  Advil/Motrin (generic ibuprofen)   Sore Throat -   Salt water gargle   Chloraseptic (generic benzocaine) spray or lozenges / Sucrets (generic dyclonine)     Generic Hip Exercises RANGE OF MOTION (ROM) AND STRETCHING EXERCISES  These exercises may help you when beginning to rehabilitate your injury. Doing them too aggressively can worsen your condition. Complete them slowly and gently. Your symptoms may resolve with or without further involvement from your physician, physical therapist or athletic trainer. While completing these exercises, remember:  4. Restoring tissue flexibility helps normal motion to return to the joints. This allows healthier, less painful movement and activity. 5. An effective stretch should be held for at least 30 seconds. 6. A stretch should never be painful. You should only feel a gentle lengthening or release in the stretched tissue. If these stretches worsen your symptoms even when done gently, consult your physician, physical therapist or athletic trainer. STRETCH - Hamstrings, Supine  2. Lie on your back. Loop a belt or towel over the ball of your right / left foot. 3. Straighten your right / left knee and slowly pull on the belt to raise your leg. Do not allow the right / left knee to bend. Keep your opposite leg flat on the floor. 4. Raise the leg until you feel a gentle stretch behind your right / left knee or thigh. Hold this position for __________ seconds. Repeat __________ times. Complete this stretch __________ times per day.  STRETCH - Hip Rotators  2. Lie on your back on a firm surface. Grasp your right / left knee with your right /  left hand and your ankle with your opposite hand. 3. Keeping your hips and shoulders firmly planted, gently pull your right / left knee and rotate your lower leg toward your opposite shoulder until you feel a stretch in your buttocks. 4. Hold this stretch for __________ seconds. Repeat this stretch  __________ times. Complete this stretch __________ times per day. STRETCH - Hamstrings/Adductors, V-Sit  3. Sit on the floor with your legs extended in a large "V," keeping your knees straight. 4. With your head and chest upright, bend at your waist reaching for your right foot to stretch your left adductors. 5. You should feel a stretch in your left inner thigh. Hold for __________ seconds. 6. Return to the upright position to relax your leg muscles. 7. Continuing to keep your chest upright, bend straight forward at your waist to stretch your hamstrings. 8. You should feel a stretch behind both of your thighs and/or knees. Hold for __________ seconds. 9. Return to the upright position to relax your leg muscles. 10. Repeat steps 2 through 4 for opposite leg. Repeat __________ times. Complete this exercise __________ times per day.  STRETCHING - Hip Flexors, Lunge 3. Half kneel with your right / left knee on the floor and your opposite knee bent and directly over your ankle. 4. Keep good posture with your head over your shoulders. Tighten your buttocks to point your tailbone downward; this will prevent your back from arching too much. 5. You should feel a gentle stretch in the front of your thigh and/or hip. If you do not feel any resistance, slightly slide your opposite foot forward and then slowly lunge forward so your knee once again lines up over your ankle. Be sure your tailbone remains pointed downward. 6. Hold this stretch for __________ seconds. Repeat __________ times. Complete this stretch __________ times per day. STRENGTHENING EXERCISES These exercises may help you when beginning to rehabilitate your injury. They may resolve your symptoms with or without further involvement from your physician, physical therapist or athletic trainer. While completing these exercises, remember:  6. Muscles can gain both the endurance and the strength needed for everyday activities through controlled  exercises. 7. Complete these exercises as instructed by your physician, physical therapist or athletic trainer. Progress the resistance and repetitions only as guided. 8. You may experience muscle soreness or fatigue, but the pain or discomfort you are trying to eliminate should never worsen during these exercises. If this pain does worsen, stop and make certain you are following the directions exactly. If the pain is still present after adjustments, discontinue the exercise until you can discuss the trouble with your clinician. STRENGTH - Hip Extensors, Bridge   Lie on your back on a firm surface. Bend your knees and place your feet flat on the floor.  Tighten your buttocks muscles and lift your bottom off the floor until your trunk is level with your thighs. You should feel the muscles in your buttocks and back of your thighs working. If you do not feel these muscles, slide your feet 1-2 inches further away from your buttocks.  Hold this position for __________ seconds.  Slowly lower your hips to the starting position and allow your buttock muscles relax completely before beginning the next repetition.  If this exercise is too easy, you may cross your arms over your chest. Repeat __________ times. Complete this exercise __________ times per day.  STRENGTH - Hip Abductors, Straight Leg Raises  Be aware of your form throughout the entire exercise  so that you exercise the correct muscles. Sloppy form means that you are not strengthening the correct muscles. 2. Lie on your side so that your head, shoulders, knee and hip line up. You may bend your lower knee to help maintain your balance. Your right / left leg should be on top. 3. Roll your hips slightly forward, so that your hips are stacked directly over each other and your right / left knee is facing forward. 4. Lift your top leg up 4-6 inches, leading with your heel. Be sure that your foot does not drift forward or that your knee does not roll  toward the ceiling. 5. Hold this position for __________ seconds. You should feel the muscles in your outer hip lifting (you may not notice this until your leg begins to tire). 6. Slowly lower your leg to the starting position. Allow the muscles to fully relax before beginning the next repetition. Repeat __________ times. Complete this exercise __________ times per day.  STRENGTH - Hip Adductors, Straight Leg Raises  3. Lie on your side so that your head, shoulders, knee and hip line up. You may place your upper foot in front to help maintain your balance. Your right / left leg should be on the bottom. 4. Roll your hips slightly forward, so that your hips are stacked directly over each other and your right / left knee is facing forward. 5. Tense the muscles in your inner thigh and lift your bottom leg 4-6 inches. Hold this position for __________ seconds. 6. Slowly lower your leg to the starting position. Allow the muscles to fully relax before beginning the next repetition. Repeat __________ times. Complete this exercise __________ times per day.  STRENGTH - Quadriceps, Straight Leg Raises  Quality counts! Watch for signs that the quadriceps muscle is working to insure you are strengthening the correct muscles and not "cheating" by substituting with healthier muscles.  Lay on your back with your right / left leg extended and your opposite knee bent.  Tense the muscles in the front of your right / left thigh. You should see either your knee cap slide up or increased dimpling just above the knee. Your thigh may even quiver.  Tighten these muscles even more and raise your leg 4 to 6 inches off the floor. Hold for right / left seconds.  Keeping these muscles tense, lower your leg.  Relax the muscles slowly and completely in between each repetition. Repeat __________ times. Complete this exercise __________ times per day.  STRENGTH - Hip Abductors, Standing  Tie one end of a rubber exercise  band/tubing to a secure surface (table, pole) and tie a loop at the other end.  Place the loop around your right / left ankle. Keeping your ankle with the band directly opposite of the secured end, step away until there is tension in the tube/band.  Hold onto a chair as needed for balance.  Keeping your back upright, your shoulders over your hips, and your toes pointing forward, lift your right / left leg out to your side. Be sure to lift your leg with your hip muscles. Do not "throw" your leg or tip your body to lift your leg.  Slowly and with control, return to the starting position. Repeat exercise __________ times. Complete this exercise __________ times per day.  STRENGTH - Quadriceps, Squats  Stand in a door frame so that your feet and knees are in line with the frame.  Use your hands for balance, not support, on  the frame.  Slowly lower your weight, bending at the hips and knees. Keep your lower legs upright so that they are parallel with the door frame. Squat only within the range that does not increase your knee pain. Never let your hips drop below your knees.  Slowly return upright, pushing with your legs, not pulling with your hands.   This information is not intended to replace advice given to you by your health care provider. Make sure you discuss any questions you have with your health care provider.   Document Released: 04/06/2005 Document Revised: 04/09/2014 Document Reviewed: 07/01/2008 Elsevier Interactive Patient Education [employer]

## 2015-03-07 NOTE — Assessment & Plan Note (Signed)
Right groin pain consistent with mild-moderate groin strain. Treat conservatively with over the counter medications as needed for symptom relief and supportive care. Start home exercise therapy. Follow up if symptoms worsen or fail to improve for further management or imaging.

## 2015-03-07 NOTE — Assessment & Plan Note (Signed)
Symptoms and exam consistent with acute upper respiratory infection. Start Ceftin. Start Hycodan as needed for cough and sleep. Continue over the counter medications as needed for symptom relief and supportive care. Follow up if symptoms worsen or fail to improve.

## 2015-03-10 ENCOUNTER — Telehealth: Payer: Self-pay | Admitting: Family

## 2015-03-10 DIAGNOSIS — R059 Cough, unspecified: Secondary | ICD-10-CM

## 2015-03-10 DIAGNOSIS — R05 Cough: Secondary | ICD-10-CM

## 2015-03-10 MED ORDER — AZITHROMYCIN 250 MG PO TABS: ORAL_TABLET | ORAL | Status: DC

## 2015-03-10 NOTE — Telephone Encounter (Signed)
Please discontinue ceftin. Azithromycin was sent to the pharmacy. A chest x-ray has been placed for him to complete if he does not get better.

## 2015-03-10 NOTE — Telephone Encounter (Signed)
Patient was in on Monday and was given ceftin and hydrocodone syrup.  States patient is still not feeling any better and feels like the medication is interfering with his blood pressure.  States that eyes have been blood shot and BP has been 146/93 (12/9) and 133/96 this morning at 6am and138/87 at 9am.  Patient is still having a lot of cough and congestion.  Also would like to know if patient needs x ray.

## 2015-03-10 NOTE — Telephone Encounter (Signed)
Please advise on next step.

## 2015-03-11 NOTE — Telephone Encounter (Signed)
Pt aware.

## 2015-12-16 ENCOUNTER — Ambulatory Visit (INDEPENDENT_AMBULATORY_CARE_PROVIDER_SITE_OTHER): Payer: 59 | Admitting: Nurse Practitioner

## 2015-12-16 ENCOUNTER — Encounter: Payer: Self-pay | Admitting: Nurse Practitioner

## 2015-12-16 VITALS — BP 164/92 | HR 100 | Temp 98.4°F | Ht 65.0 in | Wt 169.0 lb

## 2015-12-16 DIAGNOSIS — J209 Acute bronchitis, unspecified: Secondary | ICD-10-CM | POA: Diagnosis not present

## 2015-12-16 DIAGNOSIS — J0140 Acute pansinusitis, unspecified: Secondary | ICD-10-CM

## 2015-12-16 MED ORDER — IPRATROPIUM BROMIDE 0.03 % NA SOLN: 2.0000 | Freq: Two times a day (BID) | NASAL | 12 refills | Status: DC

## 2015-12-16 MED ORDER — AZITHROMYCIN 250 MG PO TABS: 250.0000 mg | ORAL_TABLET | Freq: Every day | ORAL | 0 refills | Status: DC

## 2015-12-16 MED ORDER — GUAIFENESIN ER 600 MG PO TB12: 600.0000 mg | ORAL_TABLET | Freq: Two times a day (BID) | ORAL | 0 refills | Status: DC | PRN

## 2015-12-16 MED ORDER — ALBUTEROL SULFATE HFA 108 (90 BASE) MCG/ACT IN AERS: 2.0000 | INHALATION_SPRAY | Freq: Four times a day (QID) | RESPIRATORY_TRACT | 0 refills | Status: DC | PRN

## 2015-12-16 MED ORDER — METHYLPREDNISOLONE ACETATE 80 MG/ML IJ SUSP: 80.0000 mg | Freq: Once | INTRAMUSCULAR | Status: DC

## 2015-12-16 MED ORDER — HYDROCODONE-HOMATROPINE 5-1.5 MG/5ML PO SYRP: 5.0000 mL | ORAL_SOLUTION | Freq: Every evening | ORAL | 0 refills | Status: DC | PRN

## 2015-12-16 MED FILL — IPRATROPIUM 0.03% SPRAY: 0.03 | 30 days supply | Qty: 30 | Fill #0

## 2015-12-16 MED FILL — AZITHROMYCIN 250 MG TABLET: 250 | 5 days supply | Qty: 6 | Fill #0

## 2015-12-16 MED FILL — VENTOLIN HFA 90 MCG INHALER: 108 (90 BAS | 25 days supply | Qty: 18 | Fill #0

## 2015-12-16 MED FILL — HYDROCODONE-HOMATROPINE SYR: 5-1.5 | 24 days supply | Qty: 120 | Fill #0

## 2015-12-16 NOTE — Patient Instructions (Addendum)
[facility] Instructions: Push oral hydration. Use over-the-counter  "cold" medicines  such as "Tylenol cold" , "Advil cold",  "Mucinex" or" Mucinex D"  for cough and congestion.  Avoid decongestants if you have high blood pressure. Use" Delsym" or" Robitussin" cough syrup varietis for cough.  You can use plain "Tylenol" or "Advi"l for fever, chills and achyness.   "Common cold" symptoms are usually triggered by a virus.  The antibiotics are usually not necessary. On average, a" viral cold" illness would take 4-7 days to resolve. Please, make an appointment if you are not better or if you're worse.  Return to office if no improvement in 1week.   strongly encourage tobacco use.  Check BP At home at return Return to office in 47month for BP re veal and CPE.

## 2015-12-16 NOTE — Progress Notes (Signed)
Subjective:  Patient ID: Casey Sellers, male    DOB: 19-Aug-1975  Age: 40 y.o. MRN: [id number]  CC: URI (Cold,  for 3 days Cough- [place])   URI   This is a new problem. The current episode started in the past 7 days. The problem has been rapidly worsening. There has been no fever. Associated symptoms include congestion, coughing, ear pain, headaches, a plugged ear sensation, rhinorrhea, sinus pain, a sore throat and swollen glands. Pertinent negatives include no chest pain, rash or wheezing. He has tried decongestant for the symptoms. The treatment provided mild relief.  Current every day tobacco use.  Outpatient Medications Prior to Visit  Medication Sig Dispense Refill  . azithromycin (ZITHROMAX) 250 MG tablet Take 2 tablets by mouth for 1 day and then 1 tablet daily for 4 days (Patient not taking: Reported on 12/16/2015) 6 tablet 0  . cefUROXime (CEFTIN) 500 MG tablet Take 1 tablet (500 mg total) by mouth 2 (two) times daily with a meal. (Patient not taking: Reported on 12/16/2015) 20 tablet 0  . HYDROcodone-homatropine (HYCODAN) 5-1.5 MG/5ML syrup Take 5 mLs by mouth every 8 (eight) hours as needed for cough. (Patient not taking: Reported on 12/16/2015) 180 mL 0   No facility-administered medications prior to visit.     ROS See HPI  Objective:  BP (!) 164/92 (BP Location: Left Arm, Patient Position: Sitting, Cuff Size: Normal)   Pulse 100   Temp 98.4 F (36.9 C)   Ht 5\' 5"  (1.651 m)   Wt 169 lb (76.7 kg)   SpO2 98%   BMI 28.12 kg/m   BP Readings from Last 3 Encounters:  12/16/15 (!) 164/92  03/07/15 136/88  06/16/14 (!) 160/98    Wt Readings from Last 3 Encounters:  12/16/15 169 lb (76.7 kg)  03/07/15 167 lb (75.8 kg)  06/16/14 162 lb 12.8 oz (73.8 kg)    Physical Exam  Constitutional: He is oriented to person, place, and time. No distress.  HENT:  Right Ear: No mastoid tenderness. Tympanic membrane is injected and erythematous. A middle ear effusion is present.    Left Ear: No mastoid tenderness. Tympanic membrane is injected and erythematous. A middle ear effusion is present.  Nose: Mucosal edema and rhinorrhea present. Right sinus exhibits maxillary sinus tenderness and frontal sinus tenderness. Left sinus exhibits maxillary sinus tenderness and frontal sinus tenderness.  Mouth/Throat: No oropharyngeal exudate.  Neck: Normal range of motion. Neck supple.  Cardiovascular: Normal rate and normal heart sounds.   Pulmonary/Chest: Effort normal and breath sounds normal.  Musculoskeletal: He exhibits no edema.  Lymphadenopathy:    He has cervical adenopathy.  Neurological: He is alert and oriented to person, place, and time.  Skin: Skin is warm and dry.  Vitals reviewed.   Lab Results  Component Value Date   WBC 7.6 03/08/2014   HGB 15.1 03/08/2014   HCT 44.7 03/08/2014   PLT 231.0 03/08/2014   GLUCOSE 101 (H) 03/08/2014   CHOL 196 03/08/2014   TRIG 257.0 (H) 03/08/2014   HDL 57.50 03/08/2014   LDLDIRECT 85.3 03/08/2014   NA 139 03/08/2014   K 3.9 03/08/2014   CL 107 03/08/2014   CREATININE 0.8 03/08/2014   BUN 11 03/08/2014   CO2 25 03/08/2014   TSH 1.23 03/08/2014    No results found.  Assessment & Plan:   Casey Sellers was seen today for uri.  Diagnoses and all orders for this visit:  Acute bronchitis, unspecified organism -  azithromycin (ZITHROMAX Z-PAK) 250 MG tablet; Take 1 tablet (250 mg total) by mouth daily. Take 2tabs on first day, then 1tab once a day till complete -     HYDROcodone-homatropine (HYCODAN) 5-1.5 MG/5ML syrup; Take 5 mLs by mouth at bedtime as needed for cough. -     guaiFENesin (MUCINEX) 600 MG 12 hr tablet; Take 1 tablet (600 mg total) by mouth 2 (two) times daily as needed for cough or to loosen phlegm. -     ipratropium (ATROVENT) 0.03 % nasal spray; Place 2 sprays into both nostrils 2 (two) times daily. Do not use for more than 5days. -     albuterol (PROVENTIL HFA;VENTOLIN HFA) 108 (90 Base) MCG/ACT  inhaler; Inhale 2 puffs into the lungs every 6 (six) hours as needed for wheezing or shortness of breath. -     methylPREDNISolone acetate (DEPO-MEDROL) injection 80 mg; Inject 1 mL (80 mg total) into the muscle once.  Acute pansinusitis, recurrence not specified -     azithromycin (ZITHROMAX Z-PAK) 250 MG tablet; Take 1 tablet (250 mg total) by mouth daily. Take 2tabs on first day, then 1tab once a day till complete -     HYDROcodone-homatropine (HYCODAN) 5-1.5 MG/5ML syrup; Take 5 mLs by mouth at bedtime as needed for cough. -     guaiFENesin (MUCINEX) 600 MG 12 hr tablet; Take 1 tablet (600 mg total) by mouth 2 (two) times daily as needed for cough or to loosen phlegm. -     ipratropium (ATROVENT) 0.03 % nasal spray; Place 2 sprays into both nostrils 2 (two) times daily. Do not use for more than 5days. -     albuterol (PROVENTIL HFA;VENTOLIN HFA) 108 (90 Base) MCG/ACT inhaler; Inhale 2 puffs into the lungs every 6 (six) hours as needed for wheezing or shortness of breath. -     methylPREDNISolone acetate (DEPO-MEDROL) injection 80 mg; Inject 1 mL (80 mg total) into the muscle once.   I have discontinued Mr. Mciver HYDROcodone-homatropine, cefUROXime, azithromycin, and ibuprofen. I am also having him start on azithromycin, HYDROcodone-homatropine, guaiFENesin, ipratropium, and albuterol. We will continue to administer methylPREDNISolone acetate.  Meds ordered this encounter  Medications  . DISCONTD: ibuprofen (ADVIL,MOTRIN) 200 MG tablet    Sig: Take 200 mg by mouth every 6 (six) hours as needed.  Marland Kitchen azithromycin (ZITHROMAX Z-PAK) 250 MG tablet    Sig: Take 1 tablet (250 mg total) by mouth daily. Take 2tabs on first day, then 1tab once a day till complete    Dispense:  6 tablet    Refill:  0    Order Specific Question:   Supervising Provider    Answer:   Tresa Garter [[id number]]  . HYDROcodone-homatropine (HYCODAN) 5-1.5 MG/5ML syrup    Sig: Take 5 mLs by mouth at bedtime as needed for  cough.    Dispense:  120 mL    Refill:  0    Order Specific Question:   Supervising Provider    Answer:   Tresa Garter [[id number]]  . guaiFENesin (MUCINEX) 600 MG 12 hr tablet    Sig: Take 1 tablet (600 mg total) by mouth 2 (two) times daily as needed for cough or to loosen phlegm.    Dispense:  14 tablet    Refill:  0    Order Specific Question:   Supervising Provider    Answer:   Tresa Garter [[id number]]  . ipratropium (ATROVENT) 0.03 % nasal spray    Sig: Place 2 sprays into  both nostrils 2 (two) times daily. Do not use for more than 5days.    Dispense:  30 mL    Refill:  12    Order Specific Question:   Supervising Provider    Answer:   Tresa GarterPLOTNIKOV, ALEKSEI V [[id number]]  . albuterol (PROVENTIL HFA;VENTOLIN HFA) 108 (90 Base) MCG/ACT inhaler    Sig: Inhale 2 puffs into the lungs every 6 (six) hours as needed for wheezing or shortness of breath.    Dispense:  1 Inhaler    Refill:  0    Order Specific Question:   Supervising Provider    Answer:   Tresa GarterPLOTNIKOV, ALEKSEI V [[id number]]  . methylPREDNISolone acetate (DEPO-MEDROL) injection 80 mg    Follow-up: Return in about 1 month (around 01/15/2016) for BP and CPE.  Alysia Pennaharlotte Nche, NP

## 2016-01-13 ENCOUNTER — Ambulatory Visit (INDEPENDENT_AMBULATORY_CARE_PROVIDER_SITE_OTHER): Payer: 59 | Admitting: Nurse Practitioner

## 2016-01-13 ENCOUNTER — Encounter: Payer: Self-pay | Admitting: Nurse Practitioner

## 2016-01-13 ENCOUNTER — Other Ambulatory Visit (INDEPENDENT_AMBULATORY_CARE_PROVIDER_SITE_OTHER): Payer: 59

## 2016-01-13 VITALS — BP 120/86 | Temp 98.0°F | Wt 168.0 lb

## 2016-01-13 DIAGNOSIS — Z0001 Encounter for general adult medical examination with abnormal findings: Secondary | ICD-10-CM | POA: Diagnosis not present

## 2016-01-13 DIAGNOSIS — F17200 Nicotine dependence, unspecified, uncomplicated: Secondary | ICD-10-CM | POA: Insufficient documentation

## 2016-01-13 DIAGNOSIS — R350 Frequency of micturition: Secondary | ICD-10-CM | POA: Diagnosis not present

## 2016-01-13 LAB — URINALYSIS, ROUTINE W REFLEX MICROSCOPIC
BILIRUBIN URINE: NEGATIVE
KETONES UR: NEGATIVE
Leukocytes, UA: NEGATIVE
NITRITE: NEGATIVE
PH: 6.5 (ref 5.0–8.0)
Specific Gravity, Urine: 1.01 (ref 1.000–1.030)
Total Protein, Urine: NEGATIVE
Urine Glucose: NEGATIVE
Urobilinogen, UA: 0.2 (ref 0.0–1.0)

## 2016-01-13 LAB — COMPREHENSIVE METABOLIC PANEL
ALBUMIN: 4.8 g/dL (ref 3.5–5.2)
ALT: 17 U/L (ref 0–53)
AST: 17 U/L (ref 0–37)
Alkaline Phosphatase: 55 U/L (ref 39–117)
BUN: 12 mg/dL (ref 6–23)
CHLORIDE: 104 meq/L (ref 96–112)
CO2: 28 meq/L (ref 19–32)
CREATININE: 0.82 mg/dL (ref 0.40–1.50)
Calcium: 10.1 mg/dL (ref 8.4–10.5)
GFR: 132.98 mL/min (ref >60.00)
Glucose, Bld: 92 mg/dL (ref 70–99)
Potassium: 4.1 mEq/L (ref 3.5–5.1)
SODIUM: 140 meq/L (ref 135–145)
Total Bilirubin: 0.9 mg/dL (ref 0.2–1.2)
Total Protein: 7.4 g/dL (ref 6.0–8.3)

## 2016-01-13 LAB — LIPID PANEL
CHOLESTEROL: 215 mg/dL — AB (ref 0–200)
HDL: 51.2 mg/dL (ref >39.00)
LDL CALC: 136 mg/dL — AB (ref 0–99)
NonHDL: 164.19
Total CHOL/HDL Ratio: 4
Triglycerides: 139 mg/dL (ref 0.0–149.0)
VLDL: 27.8 mg/dL (ref 0.0–40.0)

## 2016-01-13 LAB — TSH: TSH: 1.04 u[IU]/mL (ref 0.35–4.50)

## 2016-01-13 LAB — PSA: PSA: 0.42 ng/mL (ref 0.10–4.00)

## 2016-01-13 MED ORDER — CIPROFLOXACIN HCL 250 MG PO TABS: 250.0000 mg | ORAL_TABLET | Freq: Two times a day (BID) | ORAL | 0 refills | Status: DC

## 2016-01-13 MED FILL — CIPROFLOXACIN HCL 250 MG TA: 250 | 10 days supply | Qty: 21 | Fill #0

## 2016-01-13 NOTE — Progress Notes (Signed)
Pre visit review using our clinic review tool, if applicable. No additional management support is needed unless otherwise documented below in the visit note. 

## 2016-01-13 NOTE — Progress Notes (Addendum)
Subjective:    Patient ID: Casey Sellers, male    DOB: Jun 29, 1975, 40 y.o.   MRN: [id number]  Patient presents today for complete physical or establish care (new patient) and re eval of HTN.  HPI  Immunizations: (TDAP, Hep C screen, Pneumovax, Influenza, zoster)  Health Maintenance  Topic Date Due  . HIV Screening  10/05/1989  . Tetanus Vaccine  12/31/2020  . Flu Shot  Completed   Diet:none Weight:  Wt Readings from Last 3 Encounters:  01/13/16 168 lb (76.2 kg)  12/16/15 169 lb (76.7 kg)  03/07/15 167 lb (75.8 kg)   Exercise:none Fall Risk:nor fall in last 1year No flowsheet data found.  Depression/Suicide:none No flowsheet data found. No flowsheet data found. Vision:patient will schedule Dental: patient will schedule Advanced Directive: No flowsheet data found. Sexual History (birth control, marital status, STD):sexually active with fiancee, no condom use. Lives with girlfriend and 2children.  Medications and allergies reviewed with patient and updated if appropriate.  Patient Active Problem List   Diagnosis Date Noted  . Tobacco use disorder 01/13/2016  . Routine general medical examination at a health care facility 03/05/2014    No current outpatient prescriptions on file prior to visit.   No current facility-administered medications on file prior to visit.     History reviewed. No pertinent past medical history.  Past Surgical History:  Procedure Laterality Date  . KNEE SURGERY  1995   Cartiledge repair    Social History   Social History  . Marital status: Divorced    Spouse name: N/A  . Number of children: 3  . Years of education: 12   Occupational History  . [employer]    Social History Main Topics  . Smoking status: Current Every Day Smoker    Packs/day: 0.50    Years: 25.00  . Smokeless tobacco: Never Used  . Alcohol use Yes     Comment: 1/2 pint a day  . Drug use: No  . Sexual activity: Not Asked   Other Topics Concern  .  None   Social History Narrative   Born and raised in [place], [place]. Currently resides in a house with his son, finace and step-son. Fish. Fun: hang out at the house.   Denies any religious beliefs that would effect health care.     Family History  Problem Relation Age of Onset  . Hypertension Mother   . Diabetes Mother   . Diabetes Father   . Stroke Maternal Aunt         Review of Systems  Constitutional: Negative for fever, malaise/fatigue and weight loss.  HENT: Negative for congestion and sore throat.   Eyes:       Negative for visual changes  Respiratory: Negative for cough and shortness of breath.   Cardiovascular: Negative for chest pain, palpitations and leg swelling.  Gastrointestinal: Negative for blood in stool, constipation, diarrhea and heartburn.  Genitourinary: Positive for frequency. Negative for dysuria and urgency.  Musculoskeletal: Negative for falls, joint pain and myalgias.  Skin: Negative for rash.  Neurological: Negative for dizziness, sensory change and headaches.  Endo/Heme/Allergies: Does not bruise/bleed easily.  Psychiatric/Behavioral: Negative for depression, substance abuse and suicidal ideas. The patient is not nervous/anxious.     Objective:   Vitals:   01/13/16 0806  BP: 120/86  Temp: 98 F (36.7 C)    Body mass index is 27.96 kg/m.   Physical Examination:  Physical Exam  Constitutional: He is oriented to person, place, and  time and well-developed, well-nourished, and in no distress. No distress.  HENT:  Right Ear: External ear normal.  Left Ear: External ear normal.  Nose: Nose normal.  Mouth/Throat: Oropharynx is clear and moist. No oropharyngeal exudate.  Eyes: Conjunctivae and EOM are normal. Pupils are equal, round, and reactive to light. No scleral icterus.  Neck: Normal range of motion. Neck supple. No thyromegaly present.  Cardiovascular: Normal rate, normal heart sounds and intact distal pulses.   Pulmonary/Chest:  Effort normal and breath sounds normal. He exhibits no tenderness.  Abdominal: Soft. Bowel sounds are normal. He exhibits no distension. There is no tenderness.  Musculoskeletal: Normal range of motion. He exhibits no edema or tenderness.  Lymphadenopathy:    He has no cervical adenopathy.  Neurological: He is alert and oriented to person, place, and time. He has normal reflexes. Gait normal.  Skin: Skin is warm and dry.  Psychiatric: Affect and judgment normal.  Vitals reviewed.   ASSESSMENT and PLAN:  Won was seen today for hypertension.  Diagnoses and all orders for this visit:  Encounter for preventative adult health care exam with abnormal findings -     Urinalysis, Routine w reflex microscopic; Future -     Lipid panel; Future -     Comprehensive metabolic panel; Future -     TSH; Future  Increased urinary frequency -     Urinalysis, Routine w reflex microscopic; Future -     PSA; Future -     ciprofloxacin (CIPRO) 250 MG tablet; Take 1 tablet (250 mg total) by mouth 2 (two) times daily.  Tobacco use disorder    Tobacco use disorder Cutting down with use of nicorette gum and lozenges.  Recent Results (from the past 2160 hour(s))  Urinalysis, Routine w reflex microscopic     Status: Abnormal   Collection Time: 01/13/16  8:51 AM  Result Value Ref Range   Color, Urine YELLOW Yellow;Lt. Yellow   APPearance CLEAR Clear   Specific Gravity, Urine 1.010 1.000 - 1.030   pH 6.5 5.0 - 8.0   Total Protein, Urine NEGATIVE Negative   Urine Glucose NEGATIVE Negative   Ketones, ur NEGATIVE Negative   Bilirubin Urine NEGATIVE Negative   Hgb urine dipstick SMALL (A) Negative   Urobilinogen, UA 0.2 0.0 - 1.0   Leukocytes, UA NEGATIVE Negative   Nitrite NEGATIVE Negative   WBC, UA 0-2/hpf 0-2/hpf   RBC / HPF 0-2/hpf 0-2/hpf   Mucus, UA Presence of (A) None   Squamous Epithelial / LPF Rare(0-4/hpf) Rare(0-4/hpf)   Bacteria, UA Rare(<10/hpf) (A) None  PSA     Status: None    Collection Time: 01/13/16  8:51 AM  Result Value Ref Range   PSA 0.42 0.10 - 4.00 ng/mL  Lipid panel     Status: Abnormal   Collection Time: 01/13/16  8:51 AM  Result Value Ref Range   Cholesterol 215 (H) 0 - 200 mg/dL    Comment: ATP III Classification       Desirable:  < 200 mg/dL               Borderline High:  200 - 239 mg/dL          High:  > = 161 mg/dL   Triglycerides 096.0 0.0 - 149.0 mg/dL    Comment: Normal:  <454 mg/dLBorderline High:  150 - 199 mg/dL   HDL 09.81 >19.14 mg/dL   VLDL 78.2 0.0 - 95.6 mg/dL   LDL Cholesterol 213 (H) 0 -  99 mg/dL   Total CHOL/HDL Ratio 4     Comment:                Men          Women1/2 Average Risk     3.4          3.3Average Risk          5.0          4.42X Average Risk          9.6          7.13X Average Risk          15.0          11.0                       NonHDL 164.19     Comment: NOTE:  Non-HDL goal should be 30 mg/dL higher than patient's LDL goal (i.e. LDL goal of < 70 mg/dL, would have non-HDL goal of < 100 mg/dL)  Comprehensive metabolic panel     Status: None   Collection Time: 01/13/16  8:51 AM  Result Value Ref Range   Sodium 140 135 - 145 mEq/L   Potassium 4.1 3.5 - 5.1 mEq/L   Chloride 104 96 - 112 mEq/L   CO2 28 19 - 32 mEq/L   Glucose, Bld 92 70 - 99 mg/dL   BUN 12 6 - 23 mg/dL   Creatinine, Ser 6.960.82 0.40 - 1.50 mg/dL   Total Bilirubin 0.9 0.2 - 1.2 mg/dL   Alkaline Phosphatase 55 39 - 117 U/L   AST 17 0 - 37 U/L   ALT 17 0 - 53 U/L   Total Protein 7.4 6.0 - 8.3 g/dL   Albumin 4.8 3.5 - 5.2 g/dL   Calcium 29.510.1 8.4 - 28.410.5 mg/dL   GFR 132.44132.98 >01.02>60.00 mL/min  TSH     Status: None   Collection Time: 01/13/16  8:51 AM  Result Value Ref Range   TSH 1.04 0.35 - 4.50 uIU/mL      Follow up: Return in about 6 months (around 07/13/2016) for hyperlipidemia.  Alysia Pennaharlotte Nche, NP

## 2016-01-13 NOTE — Patient Instructions (Addendum)
Encourage to cut ETOH use. Also encourage to continue cutting down on tobacco use.  Health Maintenance, Male A healthy lifestyle and preventative care can promote health and wellness.  Maintain regular health, dental, and eye exams.  Eat a healthy diet. Foods like vegetables, fruits, whole grains, low-fat dairy products, and lean protein foods contain the nutrients you need and are low in calories. Decrease your intake of foods high in solid fats, added sugars, and salt. Get information about a proper diet from your health care provider, if necessary.  Regular physical exercise is one of the most important things you can do for your health. Most adults should get at least 150 minutes of moderate-intensity exercise (any activity that increases your heart rate and causes you to sweat) each week. In addition, most adults need muscle-strengthening exercises on 2 or more days a week.   Maintain a healthy weight. The body mass index (BMI) is a screening tool to identify possible weight problems. It provides an estimate of body fat based on height and weight. Your health care provider can find your BMI and can help you achieve or maintain a healthy weight. For males 20 years and older:  A BMI below 18.5 is considered underweight.  A BMI of 18.5 to 24.9 is normal.  A BMI of 25 to 29.9 is considered overweight.  A BMI of 30 and above is considered obese.  Maintain normal blood lipids and cholesterol by exercising and minimizing your intake of saturated fat. Eat a balanced diet with plenty of fruits and vegetables. Blood tests for lipids and cholesterol should begin at age 41 and be repeated every 5 years. If your lipid or cholesterol levels are high, you are over age 41, or you are at high risk for heart disease, you may need your cholesterol levels checked more frequently.Ongoing high lipid and cholesterol levels should be treated with medicines if diet and exercise are not working.  If you smoke,  find out from your health care provider how to quit. If you do not use tobacco, do not start.  Lung cancer screening is recommended for adults aged 55-80 years who are at high risk for developing lung cancer because of a history of smoking. A yearly low-dose CT scan of the lungs is recommended for people who have at least a 30-pack-year history of smoking and are current smokers or have quit within the past 15 years. A pack year of smoking is smoking an average of 1 pack of cigarettes a day for 1 year (for example, a 30-pack-year history of smoking could mean smoking 1 pack a day for 30 years or 2 packs a day for 15 years). Yearly screening should continue until the smoker has stopped smoking for at least 15 years. Yearly screening should be stopped for people who develop a health problem that would prevent them from having lung cancer treatment.  If you choose to drink alcohol, do not have more than 2 drinks per day. One drink is considered to be 12 oz (360 mL) of beer, 5 oz (150 mL) of wine, or 1.5 oz (45 mL) of liquor.  Avoid the use of street drugs. Do not share needles with anyone. Ask for help if you need support or instructions about stopping the use of drugs.  High blood pressure causes heart disease and increases the risk of stroke. High blood pressure is more likely to develop in:  People who have blood pressure in the end of the normal range (100-139/85-89 mm  Hg).  People who are overweight or obese.  People who are African American.  If you are 22-15 years of age, have your blood pressure checked every 3-5 years. If you are 97 years of age or older, have your blood pressure checked every year. You should have your blood pressure measured twice--once when you are at a hospital or clinic, and once when you are not at a hospital or clinic. Record the average of the two measurements. To check your blood pressure when you are not at a hospital or clinic, you can use:  An automated blood  pressure machine at a pharmacy.  A home blood pressure monitor.  If you are 80-41 years old, ask your health care provider if you should take aspirin to prevent heart disease.  Diabetes screening involves taking a blood sample to check your fasting blood sugar level. This should be done once every 3 years after age 63 if you are at a normal weight and without risk factors for diabetes. Testing should be considered at a younger age or be carried out more frequently if you are overweight and have at least 1 risk factor for diabetes.  Colorectal cancer can be detected and often prevented. Most routine colorectal cancer screening begins at the age of 50 and continues through age 61. However, your health care provider may recommend screening at an earlier age if you have risk factors for colon cancer. On a yearly basis, your health care provider may provide home test kits to check for hidden blood in the stool. A small camera at the end of a tube may be used to directly examine the colon (sigmoidoscopy or colonoscopy) to detect the earliest forms of colorectal cancer. Talk to your health care provider about this at age 71 when routine screening begins. A direct exam of the colon should be repeated every 5-10 years through age 13, unless early forms of precancerous polyps or small growths are found.  People who are at an increased risk for hepatitis B should be screened for this virus. You are considered at high risk for hepatitis B if:  You were born in a country where hepatitis B occurs often. Talk with your health care provider about which countries are considered high risk.  Your parents were born in a high-risk country and you have not received a shot to protect against hepatitis B (hepatitis B vaccine).  You have HIV or AIDS.  You use needles to inject street drugs.  You live with, or have sex with, someone who has hepatitis B.  You are a man who has sex with other men (MSM).  You get  hemodialysis treatment.  You take certain medicines for conditions like cancer, organ transplantation, and autoimmune conditions.  Hepatitis C blood testing is recommended for all people born from [birth year] through 1966 and any individual with known risk factors for hepatitis C.  Healthy men should no longer receive prostate-specific antigen (PSA) blood tests as part of routine cancer screening. Talk to your health care provider about prostate cancer screening.  Testicular cancer screening is not recommended for adolescents or adult males who have no symptoms. Screening includes self-exam, a health care provider exam, and other screening tests. Consult with your health care provider about any symptoms you have or any concerns you have about testicular cancer.  Practice safe sex. Use condoms and avoid high-risk sexual practices to reduce the spread of sexually transmitted infections (STIs).  You should be screened for STIs, including gonorrhea and  chlamydia if:  You are sexually active and are younger than 24 years.  You are older than 24 years, and your health care provider tells you that you are at risk for this type of infection.  Your sexual activity has changed since you were last screened, and you are at an increased risk for chlamydia or gonorrhea. Ask your health care provider if you are at risk.  If you are at risk of being infected with HIV, it is recommended that you take a prescription medicine daily to prevent HIV infection. This is called pre-exposure prophylaxis (PrEP). You are considered at risk if:  You are a man who has sex with other men (MSM).  You are a heterosexual man who is sexually active with multiple partners.  You take drugs by injection.  You are sexually active with a partner who has HIV.  Talk with your health care provider about whether you are at high risk of being infected with HIV. If you choose to begin PrEP, you should first be tested for HIV. You should  then be tested every 3 months for as long as you are taking PrEP.  Use sunscreen. Apply sunscreen liberally and repeatedly throughout the day. You should seek shade when your shadow is shorter than you. Protect yourself by wearing long sleeves, pants, a wide-brimmed hat, and sunglasses year round whenever you are outdoors.  Tell your health care provider of new moles or changes in moles, especially if there is a change in shape or color. Also, tell your health care provider if a mole is larger than the size of a pencil eraser.  A one-time screening for abdominal aortic aneurysm (AAA) and surgical repair of large AAAs by ultrasound is recommended for men aged 41-75 years who are current or former smokers.  Stay current with your vaccines (immunizations).   This information is not intended to replace advice given to you by your health care provider. Make sure you discuss any questions you have with your health care provider.   Document Released: 09/15/2007 Document Revised: 04/09/2014 Document Reviewed: 08/14/2010 Elsevier Interactive Patient Education [employer].

## 2016-01-13 NOTE — Addendum Note (Signed)
Addended by: Alysia PennaNCHE, CHARLOTTE L on: 01/13/2016 05:40 PM   Modules accepted: Orders

## 2016-01-13 NOTE — Assessment & Plan Note (Signed)
Cutting down with use of nicorette gum and lozenges.

## 2016-07-20 ENCOUNTER — Ambulatory Visit: Payer: 59 | Admitting: Family

## 2016-07-24 ENCOUNTER — Ambulatory Visit (INDEPENDENT_AMBULATORY_CARE_PROVIDER_SITE_OTHER): Payer: 59 | Admitting: Family

## 2016-07-24 VITALS — BP 122/80 | HR 75 | Temp 98.3°F | Ht 65.0 in | Wt 168.1 lb

## 2016-07-24 DIAGNOSIS — E7800 Pure hypercholesterolemia, unspecified: Secondary | ICD-10-CM

## 2016-07-24 DIAGNOSIS — M542 Cervicalgia: Secondary | ICD-10-CM | POA: Diagnosis not present

## 2016-07-24 HISTORY — DX: Pure hypercholesterolemia, unspecified: E78.00

## 2016-07-24 MED ORDER — DICLOFENAC SODIUM 2 % TD SOLN: 1.0000 "application " | Freq: Two times a day (BID) | TRANSDERMAL | 1 refills | Status: DC | PRN

## 2016-07-24 NOTE — Progress Notes (Signed)
Subjective:    Patient ID: Casey Sellers, male    DOB: 16-Aug-1975, 41 y.o.   MRN: [id number]  Chief Complaint  Patient presents with  . Follow-up    routine 6 month     HPI:  Casey Sellers is a 41 y.o. male who  has no past medical history on file. and presents today for a follow up office visit.   1.) Cervical muscle pain - This is a new problem. Associated symptom of pain located in his neck and back has been going on for a couple months. No trauma or injury. Symptoms were of gradual onset. Course of the symptoms generally waxes and wanes. No numbness or tingling. No weakness. Severity is mild and does effect his sleep at times.   2.) Elevated LDL - previously noted to have an elevated LDL level of 169 with instructions for lifestyle management. Has been working on improving his nutrition and increasing physical activity. No chest pain, shortness of breath or PND.    No Known Allergies    Outpatient Medications Prior to Visit  Medication Sig Dispense Refill  . ciprofloxacin (CIPRO) 250 MG tablet Take 1 tablet (250 mg total) by mouth 2 (two) times daily. (Patient not taking: Reported on 07/24/2016) 21 tablet 0   No facility-administered medications prior to visit.      Review of Systems  Constitutional: Negative for chills and fever.  Respiratory: Negative for chest tightness and shortness of breath.   Cardiovascular: Negative for chest pain, palpitations and leg swelling.  Musculoskeletal: Positive for back pain and neck pain.  Neurological: Negative for dizziness, weakness, numbness and headaches.      Objective:    BP 122/80 (BP Location: Left Arm, Patient Position: Sitting, Cuff Size: Normal)   Pulse 75   Temp 98.3 F (36.8 C) (Oral)   Ht  (1.651 m)   Wt 168 lb 1.9 oz (76.3 kg)   SpO2 99%   BMI 27.98 kg/m  Nursing note and vital signs reviewed.  Physical Exam  Constitutional: He is oriented to person, place, and time. He appears well-developed  and well-nourished. No distress.  Neck:  No obvious deformity, discoloration, or edema. Palpable tenderness and mild increased muscle tone of the right upper trapezius. Range of motion appears adequately with slight decrease in lateral bending bilaterally. Cervical compression test negative. Distal pulses and sensation intact and appropriate.  Cardiovascular: Normal rate, regular rhythm, normal heart sounds and intact distal pulses.   Pulmonary/Chest: Effort normal and breath sounds normal.  Musculoskeletal:  Mid/low back - no obvious deformity, discoloration, or edema. Palpable tenderness along upper lumbar lower thoracic paraspinal musculature. No crepitus or deformity. Range of motion within normal limits. Distal pulses and sensation are intact and appropriate. Negative straight leg raise and negative Faber's.  Neurological: He is alert and oriented to person, place, and time.  Skin: Skin is warm and dry.  Psychiatric: He has a normal mood and affect. His behavior is normal. Judgment and thought content normal.       Assessment & Plan:   Problem List Items Addressed This Visit      Other   Cervical muscle pain - Primary    Cervical muscle pain most likely associated cervical muscle tightness. Encouraged to evaluate pillow and mattress. Treat conservatively with ice, moist heat, home exercise therapy and start Pennsaid. Follow-up if symptoms worsen or do not improve.      Elevated LDL cholesterol level    Elevated LDL with  most recent blood work. Has been working on nutrition and physical activity. Obtain lipid profile. Continue lifestyle management pending lipid profile results.       Relevant Orders   Lipid Profile       I have discontinued Mr. Garriga ciprofloxacin. I am also having him start on Diclofenac Sodium.   Follow-up: Return if symptoms worsen or fail to improve.  Jeanine Luz, FNP

## 2016-07-24 NOTE — Assessment & Plan Note (Signed)
Elevated LDL with most recent blood work. Has been working on nutrition and physical activity. Obtain lipid profile. Continue lifestyle management pending lipid profile results.

## 2016-07-24 NOTE — Assessment & Plan Note (Signed)
Cervical muscle pain most likely associated cervical muscle tightness. Encouraged to evaluate pillow and mattress. Treat conservatively with ice, moist heat, home exercise therapy and start Pennsaid. Follow-up if symptoms worsen or do not improve.

## 2016-07-24 NOTE — Progress Notes (Signed)
Pre visit review using our clinic review tool, if applicable. No additional management support is needed unless otherwise documented below in the visit note. 

## 2016-07-24 NOTE — Patient Instructions (Signed)
Thank you for choosing [facility].  SUMMARY AND INSTRUCTIONS:  Ice / moist heat x 20 minutes as needed.  Stretches and exercises regularly.   Medication:  Your prescription(s) have been submitted to your pharmacy or been printed and provided for you. Please take as directed and contact our office if you believe you are having problem(s) with the medication(s) or have any questions.  Follow up:  If your symptoms worsen or fail to improve, please contact our office for further instruction, or in case of emergency go directly to the emergency room at the closest medical facility.    Cervical Strain and Sprain Rehab Ask your health care provider which exercises are safe for you. Do exercises exactly as told by your health care provider and adjust them as directed. It is normal to feel mild stretching, pulling, tightness, or discomfort as you do these exercises, but you should stop right away if you feel sudden pain or your pain gets worse.Do not begin these exercises until told by your health care provider. Stretching and range of motion exercises These exercises warm up your muscles and joints and improve the movement and flexibility of your neck. These exercises also help to relieve pain, numbness, and tingling. Exercise A: Cervical side bend   1. Using good posture, sit on a stable chair or stand up. 2. Without moving your shoulders, slowly tilt your left / right ear to your shoulder until you feel a stretch in your neck muscles. You should be looking straight ahead. 3. Hold for __________ seconds. 4. Repeat with the other side of your neck. Repeat __________ times. Complete this exercise __________ times a day. Exercise B: Cervical rotation   1. Using good posture, sit on a stable chair or stand up. 2. Slowly turn your head to the side as if you are looking over your left / right shoulder.  Keep your eyes level with the ground.  Stop when you feel a stretch along the side  and the back of your neck. 3. Hold for __________ seconds. 4. Repeat this by turning to your other side. Repeat __________ times. Complete this exercise __________ times a day. Exercise C: Thoracic extension and pectoral stretch  1. Roll a towel or a small blanket so it is about 4 inches (10 cm) in diameter. 2. Lie down on your back on a firm surface. 3. Put the towel lengthwise, under your spine in the middle of your back. It should not be not under your shoulder blades. The towel should line up with your spine from your middle back to your lower back. 4. Put your hands behind your head and let your elbows fall out to your sides. 5. Hold for __________ seconds. Repeat __________ times. Complete this exercise __________ times a day. Strengthening exercises These exercises build strength and endurance in your neck. Endurance is the ability to use your muscles for a long time, even after your muscles get tired. Exercise D: Upper cervical flexion, isometric  1. Lie on your back with a thin pillow behind your head and a small rolled-up towel under your neck. 2. Gently tuck your chin toward your chest and nod your head down to look toward your feet. Do not lift your head off the pillow. 3. Hold for __________ seconds. 4. Release the tension slowly. Relax your neck muscles completely before you repeat this exercise. Repeat __________ times. Complete this exercise __________ times a day. Exercise E: Cervical extension, isometric   1. Stand about 6 inches (  15 cm) away from a wall, with your back facing the wall. 2. Place a soft object, about 6-8 inches (15-20 cm) in diameter, between the back of your head and the wall. A soft object could be a small pillow, a ball, or a folded towel. 3. Gently tilt your head back and press into the soft object. Keep your jaw and forehead relaxed. 4. Hold for __________ seconds. 5. Release the tension slowly. Relax your neck muscles completely before you repeat this  exercise. Repeat __________ times. Complete this exercise __________ times a day. Posture and body mechanics   Body mechanics refers to the movements and positions of your body while you do your daily activities. Posture is part of body mechanics. Good posture and healthy body mechanics can help to relieve stress in your body's tissues and joints. Good posture means that your spine is in its natural S-curve position (your spine is neutral), your shoulders are pulled back slightly, and your head is not tipped forward. The following are general guidelines for applying improved posture and body mechanics to your everyday activities. Standing   When standing, keep your spine neutral and keep your feet about hip-width apart. Keep a slight bend in your knees. Your ears, shoulders, and hips should line up.  When you do a task in which you stand in one place for a long time, place one foot up on a stable object that is 2-4 inches (5-10 cm) high, such as a footstool. This helps keep your spine neutral. Sitting    When sitting, keep your spine neutral and your keep feet flat on the floor. Use a footrest, if necessary, and keep your thighs parallel to the floor. Avoid rounding your shoulders, and avoid tilting your head forward.  When working at a desk or a computer, keep your desk at a height where your hands are slightly lower than your elbows. Slide your chair under your desk so you are close enough to maintain good posture.  When working at a computer, place your monitor at a height where you are looking straight ahead and you do not have to tilt your head forward or downward to look at the screen. Resting  When lying down and resting, avoid positions that are most painful for you. Try to support your neck in a neutral position. You can use a contour pillow or a small rolled-up towel. Your pillow should support your neck but not push on it. This information is not intended to replace advice given to you  by your health care provider. Make sure you discuss any questions you have with your health care provider. Document Released: 03/19/2005 Document Revised: 11/24/2015 Document Reviewed: 02/23/2015 Elsevier Interactive Patient Education  2017 [employer]   Low Back Sprain Rehab Ask your health care provider which exercises are safe for you. Do exercises exactly as told by your health care provider and adjust them as directed. It is normal to feel mild stretching, pulling, tightness, or discomfort as you do these exercises, but you should stop right away if you feel sudden pain or your pain gets worse. Do not begin these exercises until told by your health care provider. Stretching and range of motion exercises These exercises warm up your muscles and joints and improve the movement and flexibility of your back. These exercises also help to relieve pain, numbness, and tingling. Exercise A: Lumbar rotation   Lie on your back on a firm surface and bend your knees. Straighten your arms out to  your sides so each arm forms an "L" shape with a side of your body (a 90 degree angle). Slowly move both of your knees to one side of your body until you feel a stretch in your lower back. Try not to let your shoulders move off of the floor. Hold for __________ seconds. Tense your abdominal muscles and slowly move your knees back to the starting position. Repeat this exercise on the other side of your body. Repeat __________ times. Complete this exercise __________ times a day. Exercise B: Prone extension on elbows   Lie on your abdomen on a firm surface. Prop yourself up on your elbows. Use your arms to help lift your chest up until you feel a gentle stretch in your abdomen and your lower back. This will place some of your body weight on your elbows. If this is uncomfortable, try stacking pillows under your chest. Your hips should stay down, against the surface that you are lying on. Keep your hip and back  muscles relaxed. Hold for __________ seconds. Slowly relax your upper body and return to the starting position. Repeat __________ times. Complete this exercise __________ times a day. Strengthening exercises These exercises build strength and endurance in your back. Endurance is the ability to use your muscles for a long time, even after they get tired. Exercise C: Pelvic tilt  Lie on your back on a firm surface. Bend your knees and keep your feet flat. Tense your abdominal muscles. Tip your pelvis up toward the ceiling and flatten your lower back into the floor. To help with this exercise, you may place a small towel under your lower back and try to push your back into the towel. Hold for __________ seconds. Let your muscles relax completely before you repeat this exercise. Repeat __________ times. Complete this exercise __________ times a day. Exercise D: Alternating arm and leg raises   Get on your hands and knees on a firm surface. If you are on a hard floor, you may want to use padding to cushion your knees, such as an exercise mat. Line up your arms and legs. Your hands should be below your shoulders, and your knees should be below your hips. Lift your left leg behind you. At the same time, raise your right arm and straighten it in front of you. Do not lift your leg higher than your hip. Do not lift your arm higher than your shoulder. Keep your abdominal and back muscles tight. Keep your hips facing the ground. Do not arch your back. Keep your balance carefully, and do not hold your breath. Hold for __________ seconds. Slowly return to the starting position and repeat with your right leg and your left arm. Repeat __________ times. Complete this exercise __________ times a day. Exercise E: Abdominal set with straight leg raise   Lie on your back on a firm surface. Bend one of your knees and keep your other leg straight. Tense your abdominal muscles and lift your straight leg up,  4-6 inches (10-15 cm) off the ground. Keep your abdominal muscles tight and hold for __________ seconds. Do not hold your breath. Do not arch your back. Keep it flat against the ground. Keep your abdominal muscles tense as you slowly lower your leg back to the starting position. Repeat with your other leg. Repeat __________ times. Complete this exercise __________ times a day. Posture and body mechanics   Body mechanics refers to the movements and positions of your body while you do your  daily activities. Posture is part of body mechanics. Good posture and healthy body mechanics can help to relieve stress in your body's tissues and joints. Good posture means that your spine is in its natural S-curve position (your spine is neutral), your shoulders are pulled back slightly, and your head is not tipped forward. The following are general guidelines for applying improved posture and body mechanics to your everyday activities. Standing   When standing, keep your spine neutral and your feet about hip-width apart. Keep a slight bend in your knees. Your ears, shoulders, and hips should line up. When you do a task in which you stand in one place for a long time, place one foot up on a stable object that is 2-4 inches (5-10 cm) high, such as a footstool. This helps keep your spine neutral. Sitting   When sitting, keep your spine neutral and keep your feet flat on the floor. Use a footrest, if necessary, and keep your thighs parallel to the floor. Avoid rounding your shoulders, and avoid tilting your head forward. When working at a desk or a computer, keep your desk at a height where your hands are slightly lower than your elbows. Slide your chair under your desk so you are close enough to maintain good posture. When working at a computer, place your monitor at a height where you are looking straight ahead and you do not have to tilt your head forward or downward to look at the screen. Resting   When lying  down and resting, avoid positions that are most painful for you. If you have pain with activities such as sitting, bending, stooping, or squatting (flexion-based activities), lie in a position in which your body does not bend very much. For example, avoid curling up on your side with your arms and knees near your chest (fetal position). If you have pain with activities such as standing for a long time or reaching with your arms (extension-based activities), lie with your spine in a neutral position and bend your knees slightly. Try the following positions: Lying on your side with a pillow between your knees. Lying on your back with a pillow under your knees. Lifting   When lifting objects, keep your feet at least shoulder-width apart and tighten your abdominal muscles. Bend your knees and hips and keep your spine neutral. It is important to lift using the strength of your legs, not your back. Do not lock your knees straight out. Always ask for help to lift heavy or awkward objects. This information is not intended to replace advice given to you by your health care provider. Make sure you discuss any questions you have with your health care provider. Document Released: 03/19/2005 Document Revised: 11/24/2015 Document Reviewed: 12/29/2014 Elsevier Interactive Patient Education  2017 [employer].

## 2016-09-27 ENCOUNTER — Telehealth: Payer: 59 | Admitting: Family

## 2016-09-27 DIAGNOSIS — J028 Acute pharyngitis due to other specified organisms: Secondary | ICD-10-CM

## 2016-09-27 DIAGNOSIS — B9689 Other specified bacterial agents as the cause of diseases classified elsewhere: Secondary | ICD-10-CM | POA: Diagnosis not present

## 2016-09-27 MED ORDER — AZITHROMYCIN 250 MG PO TABS: ORAL_TABLET | ORAL | 0 refills | Status: DC

## 2016-09-27 MED ORDER — BENZONATATE 100 MG PO CAPS: 100.0000 mg | ORAL_CAPSULE | Freq: Three times a day (TID) | ORAL | 0 refills | Status: DC | PRN

## 2016-09-27 MED FILL — AZITHROMYCIN 250 MG TAB: 250 | 5 days supply | Qty: 6 | Fill #0

## 2016-09-27 MED FILL — BENZONATATE 100 MG CAP: 100 | 5 days supply | Qty: 30 | Fill #0

## 2016-09-27 NOTE — Progress Notes (Signed)
Thank you for the details you put in the comment boxes. Those details really help us take better care of you.   We are sorry that you are not feeling well.  Here is how we plan to help!  Based on what you have shared with me it looks like you have upper respiratory tract inflammation that has resulted in a significant cough.  Inflammation and infection in the upper respiratory tract is commonly called bronchitis and has four common causes:  Allergies, Viral Infections, Acid Reflux and Bacterial Infections.  Allergies, viruses and acid reflux are treated by controlling symptoms or eliminating the cause. An example might be a cough caused by taking certain blood pressure medications. You stop the cough by changing the medication. Another example might be a cough caused by acid reflux. Controlling the reflux helps control the cough.  Based on your presentation I believe you most likely have A cough due to bacteria.  When patients have a fever and a productive cough with a change in color or increased sputum production, we are concerned about bacterial bronchitis.  If left untreated it can progress to pneumonia.  If your symptoms do not improve with your treatment plan it is important that you contact your provider.   I have prescribed Azithromyin 250 mg: two tables now and then one tablet daily for 4 additonal days    In addition you may use A non-prescription cough medication called Mucinex DM: take 2 tablets every 12 hours. and A prescription cough medication called Tessalon Perles 100mg. You may take 1-2 capsules every 8 hours as needed for your cough.   USE OF BRONCHODILATOR ("RESCUE") INHALERS: There is a risk from using your bronchodilator too frequently.  The risk is that over-reliance on a medication which only relaxes the muscles surrounding the breathing tubes can reduce the effectiveness of medications prescribed to reduce swelling and congestion of the tubes themselves.  Although you feel brief  relief from the bronchodilator inhaler, your asthma may actually be worsening with the tubes becoming more swollen and filled with mucus.  This can delay other crucial treatments, such as oral steroid medications. If you need to use a bronchodilator inhaler daily, several times per day, you should discuss this with your provider.  There are probably better treatments that could be used to keep your asthma under control.     HOME CARE . Only take medications as instructed by your medical team. . Complete the entire course of an antibiotic. . Drink plenty of fluids and get plenty of rest. . Avoid close contacts especially the very young and the elderly . Cover your mouth if you cough or cough into your sleeve. . Always remember to wash your hands . A steam or ultrasonic humidifier can help congestion.   GET HELP RIGHT AWAY IF: . You develop worsening fever. . You become short of breath . You cough up blood. . Your symptoms persist after you have completed your treatment plan MAKE SURE YOU   Understand these instructions.  Will watch your condition.  Will get help right away if you are not doing well or get worse.  Your e-visit answers were reviewed by a board certified advanced clinical practitioner to complete your personal care plan.  Depending on the condition, your plan could have included both over the counter or prescription medications. If there is a problem please reply  once you have received a response from your provider. Your safety is important to us.  If you   have drug allergies check your prescription carefully.    You can use MyChart to ask questions about today's visit, request a non-urgent call back, or ask for a work or school excuse for 24 hours related to this e-Visit. If it has been greater than 24 hours you will need to follow up with your provider, or enter a new e-Visit to address those concerns. You will get an e-mail in the next two days asking about your  experience.  I hope that your e-visit has been valuable and will speed your recovery. Thank you for using e-visits.   

## 2016-09-28 ENCOUNTER — Telehealth: Payer: 59 | Admitting: Family

## 2016-09-28 DIAGNOSIS — L708 Other acne: Secondary | ICD-10-CM | POA: Diagnosis not present

## 2016-09-28 MED ORDER — MINOCYCLINE HCL 100 MG PO CAPS: 100.0000 mg | ORAL_CAPSULE | Freq: Two times a day (BID) | ORAL | 0 refills | Status: DC

## 2016-09-28 MED ORDER — BENZOYL PEROXIDE-ERYTHROMYCIN 5-3 % EX GEL: Freq: Two times a day (BID) | CUTANEOUS | 0 refills | Status: DC

## 2016-09-28 MED FILL — ERYTHROMYCIN-BENZOYL GEL: 5-3 | 20 days supply | Qty: 23 | Fill #0

## 2016-09-28 MED FILL — MINOCYCLINE 100 MG CAPSULE: 100 | 30 days supply | Qty: 60 | Fill #0

## 2016-09-28 NOTE — Progress Notes (Signed)
Thank you for the details you put in the comment boxes. Those details really help us take better care of you.   We are sorry that you are experiencing this issue.  Here is how we plan to help!  Based on what you shared with me it looks like you have uncomplicated acne.  Acne is a disorder of the hair follicles and oil glands (sebaceous glands). The sebaceous glands secrete oils to keep the skin moist.  When the glands get clogged, it can lead to pimples or cysts.  These cysts may become infected and leave scars. Acne is very common and normally occurs at puberty.  Acne is also inherited.  Your personal care plan consists of the following recommendations:  I recommend that you use a daily cleanser  You may try a topical exfoliator and salicylic acid scrub.  These scrubs have coarse particles that clear your pores but may also irritate your skin.  I have prescribed a topical gel with an antibiotic:  Benzoyl peroxide-erythromycin gel.  This gel should be applied to the affected areas twice a day. Be sure to read the package insert for potential side effects.  I have also prescribed one of the following additional therapies:  Minocycline an oral antibiotic 50 mg twice a day   If excessive dryness or peeling occurs, reduce dose frequency or concentration of the topical scrubs.  If excessive stinging or burning occurs, remove the topical gel with mild soap and water and resume at a lower dose the next day.  Remember oral antibiotics and topical acne treatments may increase your sensitivity to the sun!  HOME CARE:  Do not squeeze pimples because that can often lead to infections, worse acne, and scars.  Use a moisturizer that contains retinoid or fruit acids that may inhibit the development of new acne lesions.  Although there is not a clear link that foods can cause acne, doctors do believe that too many sweets predispose you to skin problems.  GET HELP RIGHT AWAY IF:  If your acne gets  worse or is not better within 10 days.  If you become depressed.  If you become pregnant, discontinue medications and call your OB/GYN.  MAKE SURE YOU:  Understand these instructions.  Will watch your condition.  Will get help right away if you are not doing well or get worse.   Your e-visit answers were reviewed by a board certified advanced clinical practitioner to complete your personal care plan.  Depending upon the condition, your plan could have included both over the counter or prescription medications.  Please review your pharmacy choice.  If there is a problem, you may contact your provider through [employer] messaging and have the prescription routed to another pharmacy.  Your safety is important to us.  If you have drug allergies check your prescription carefully.  For the next 24 hours you can use MyChart to ask questions about today's visit, request a non-urgent call back, or ask for a work or school excuse from your e-visit provider.  You will get an email in the next two days asking about your experience. I hope that your e-visit has been valuable and will speed your recovery.

## 2017-04-15 ENCOUNTER — Encounter: Payer: Self-pay | Admitting: Family

## 2017-04-15 ENCOUNTER — Ambulatory Visit (INDEPENDENT_AMBULATORY_CARE_PROVIDER_SITE_OTHER): Payer: 59 | Admitting: Family

## 2017-04-15 VITALS — BP 130/80 | HR 102 | Temp 99.8°F | Ht 65.0 in | Wt 170.0 lb

## 2017-04-15 DIAGNOSIS — R6889 Other general symptoms and signs: Secondary | ICD-10-CM | POA: Diagnosis not present

## 2017-04-15 DIAGNOSIS — J101 Influenza due to other identified influenza virus with other respiratory manifestations: Secondary | ICD-10-CM

## 2017-04-15 LAB — POC INFLUENZA A&B (BINAX/QUICKVUE)
Influenza A, POC: NEGATIVE
Influenza B, POC: POSITIVE — AB

## 2017-04-15 MED ORDER — ALBUTEROL SULFATE HFA 108 (90 BASE) MCG/ACT IN AERS: 2.0000 | INHALATION_SPRAY | Freq: Four times a day (QID) | RESPIRATORY_TRACT | 0 refills | Status: DC | PRN

## 2017-04-15 MED ORDER — HYDROCOD POLST-CPM POLST ER 10-8 MG/5ML PO SUER: 5.0000 mL | Freq: Two times a day (BID) | ORAL | 0 refills | Status: DC | PRN

## 2017-04-15 MED ORDER — OSELTAMIVIR PHOSPHATE 75 MG PO CAPS: 75.0000 mg | ORAL_CAPSULE | Freq: Two times a day (BID) | ORAL | 0 refills | Status: AC

## 2017-04-15 MED FILL — OSELTAMIVIR PHOS 75 MG CAP: 75 | 5 days supply | Qty: 10 | Fill #0

## 2017-04-15 MED FILL — HYDROCODONE-CHLORPHENIRAM S: 10-8 | 11 days supply | Qty: 115 | Fill #0

## 2017-04-15 NOTE — Progress Notes (Signed)
Casey Sellers is a 42 y.o. male with the following history as recorded in EpicCare:  Patient Active Problem List   Diagnosis Date Noted  . Cervical muscle pain 07/24/2016  . Elevated LDL cholesterol level 07/24/2016  . Tobacco use disorder 01/13/2016  . Routine general medical examination at a health care facility 03/05/2014    Current Outpatient Medications  Medication Sig Dispense Refill  . albuterol (PROVENTIL HFA;VENTOLIN HFA) 108 (90 Base) MCG/ACT inhaler Inhale 2 puffs into the lungs every 6 (six) hours as needed for wheezing or shortness of breath. 1 Inhaler 0  . chlorpheniramine-HYDROcodone (TUSSIONEX PENNKINETIC ER) 10-8 MG/5ML SUER Take 5 mLs by mouth every 12 (twelve) hours as needed for cough. 115 mL 0  . oseltamivir (TAMIFLU) 75 MG capsule Take 1 capsule (75 mg total) by mouth 2 (two) times daily for 5 days. 10 capsule 0   No current facility-administered medications for this visit.     Allergies: Patient has no known allergies.  History reviewed. No pertinent past medical history.  Past Surgical History:  Procedure Laterality Date  . KNEE SURGERY  1995   Cartiledge repair    Family History  Problem Relation Age of Onset  . Hypertension Mother   . Diabetes Mother   . Diabetes Father   . Stroke Maternal Aunt     Social History   Tobacco Use  . Smoking status: Current Every Day Smoker    Packs/day: 0.50    Years: 25.00    Pack years: 12.50  . Smokeless tobacco: Never Used  Substance Use Topics  . Alcohol use: Yes    Comment: 1/2 pint a day    Subjective:  Presents with flu-like symptoms that have started within the past 24 hours; complaining of headache, chills, fever, body aches; notes that wife and son have both tested positive for flu; denies any difficulty breathing; has heard himself wheezing occasionally;   Objective:  Vitals:   04/15/17 0958  BP: 130/80  Pulse: (!) 102  Temp: 99.8 F (37.7 C)  TempSrc: Oral  SpO2: 99%  Weight: 170 lb 0.6  oz (77.1 kg)  Height: 5\' 5"  (1.651 m)    General: Well developed, well nourished, in no acute distress  Skin : Warm and dry.  Head: Normocephalic and atraumatic  Eyes: Sclera and conjunctiva clear; pupils round and reactive to light; extraocular movements intact  Ears: External normal; canals clear; tympanic membranes normal  Oropharynx: Pink, supple. No suspicious lesions  Neck: Supple without thyromegaly, adenopathy  Lungs: Respirations unlabored; clear to auscultation bilaterally without wheeze, rales, rhonchi  CVS exam: normal rate and regular rhythm.  Neurologic: Alert and oriented; speech intact; face symmetrical; moves all extremities well; CNII-XII intact without focal deficit   Assessment:  1. Flu-like symptoms     Plan:  Flu test + in office; Rx for Tamiflu 75 mg bid x 5 days, Tussionex and albuterol; increase fluids, symptomatic treatment discussed; work note given for this week- will complete FMLA if needed; follow-up worse, no better.   No Follow-up on file.  Orders Placed This Encounter  Procedures  . POC Influenza A&B (Binax test)    Requested Prescriptions   Signed Prescriptions Disp Refills  . oseltamivir (TAMIFLU) 75 MG capsule 10 capsule 0    Sig: Take 1 capsule (75 mg total) by mouth 2 (two) times daily for 5 days.  Marland Kitchen albuterol (PROVENTIL HFA;VENTOLIN HFA) 108 (90 Base) MCG/ACT inhaler 1 Inhaler 0    Sig: Inhale 2 puffs into  the lungs every 6 (six) hours as needed for wheezing or shortness of breath.  . chlorpheniramine-HYDROcodone (TUSSIONEX PENNKINETIC ER) 10-8 MG/5ML SUER 115 mL 0    Sig: Take 5 mLs by mouth every 12 (twelve) hours as needed for cough.

## 2017-04-16 ENCOUNTER — Telehealth: Payer: Self-pay | Admitting: General Practice

## 2017-04-16 NOTE — Telephone Encounter (Signed)
I have called patient.  He seen [place] on 1/14 and was written out of work for this week.  Due to his number of points at work his employer has requested him to completed FMLA forms.

## 2017-04-16 NOTE — Telephone Encounter (Signed)
Copied from CRM [id number]. Topic: General - Other >> Apr 16, 2017 10:44 AM Viviann SpareWhite, Selina wrote: Reason for CRM: Patient called to let us know that his job have faxed over some [employer] ppwk to be filled out. Patient would like for the ppwk to go to the attn of Laurey MoraleMary Costa she can be reached at [phone] for questions, fax number [phone].

## 2017-04-18 DIAGNOSIS — R079 Chest pain, unspecified: Secondary | ICD-10-CM | POA: Diagnosis not present

## 2017-04-18 NOTE — Telephone Encounter (Signed)
Forms have been completed & placed on Laura's desk to review and sign. She will be in the office on 04/19/17.

## 2017-04-19 DIAGNOSIS — Z0279 Encounter for issue of other medical certificate: Secondary | ICD-10-CM

## 2017-04-19 NOTE — Telephone Encounter (Signed)
Forms have been signed, copy sent to scan, &charged for.   Original mailed to patient.

## 2017-05-15 ENCOUNTER — Ambulatory Visit (INDEPENDENT_AMBULATORY_CARE_PROVIDER_SITE_OTHER): Payer: 59 | Admitting: Nurse Practitioner

## 2017-05-15 ENCOUNTER — Encounter: Payer: Self-pay | Admitting: Nurse Practitioner

## 2017-05-15 VITALS — BP 124/68 | HR 100 | Temp 98.8°F | Resp 16 | Ht 65.0 in | Wt 163.0 lb

## 2017-05-15 DIAGNOSIS — E7800 Pure hypercholesterolemia, unspecified: Secondary | ICD-10-CM

## 2017-05-15 NOTE — Progress Notes (Signed)
Name: Casey Sellers   MRN: [id number]    DOB: 11/01/1975   Date:05/15/2017       Progress Note  Subjective  Chief Complaint  Chief Complaint  Patient presents with  . Establish Care    HPI Mr Casey Sellers is establishing care with me today. He would like to discuss his cholesterol. He was told last year that his cholesterol was elevated and that he should work on diet and exercise to improve this. He was supposed to have labwork redrawn last April to follow up on his cholesterol but he did not have the blood drawn. He is requesting to have lipid panel today. He says he has been working on [employer] diet and exercise-hes been avoiding carbohydrates eating steamed vegetables and baked meats and exercising about 4 times a week at the gym. He also stopped smoking cigarettes and drinking alcohol about 1 month ago. He denies syncope, dizziness, chest pain, shortness of breath, weakness.  Lab Results  Component Value Date   CHOL 215 (H) 01/13/2016   HDL 51.20 01/13/2016   LDLCALC 136 (H) 01/13/2016   LDLDIRECT 85.3 03/08/2014   TRIG 139.0 01/13/2016   CHOLHDL 4 01/13/2016     Patient Active Problem List   Diagnosis Date Noted  . Cervical muscle pain 07/24/2016  . Elevated LDL cholesterol level 07/24/2016  . Tobacco use disorder 01/13/2016  . Routine general medical examination at a health care facility 03/05/2014    Past Surgical History:  Procedure Laterality Date  . KNEE SURGERY  1995   Cartiledge repair    Family History  Problem Relation Age of Onset  . Hypertension Mother   . Diabetes Mother   . Diabetes Father   . Stroke Maternal Aunt     Social History   Socioeconomic History  . Marital status: Divorced    Spouse name: Not on file  . Number of children: 3  . Years of education: 3612  . Highest education level: Not on file  Social Needs  . Financial resource strain: Not on file  . Food insecurity - worry: Not on file  . Food insecurity - inability: Not on file  .  Transportation needs - medical: Not on file  . Transportation needs - non-medical: Not on file  Occupational History  . Occupation: [employer]  Tobacco Use  . Smoking status: Current Every Day Smoker    Packs/day: 0.50    Years: 25.00    Pack years: 12.50  . Smokeless tobacco: Never Used  Substance and Sexual Activity  . Alcohol use: Yes    Comment: 1/2 pint a day  . Drug use: No  . Sexual activity: Not on file  Other Topics Concern  . Not on file  Social History Narrative   [place] Currently resides in a house with his son, finace and step-son. Fish. Fun: hang out at the house.   Denies any religious beliefs that would effect health care.     No current outpatient medications on file.  No Known Allergies   ROS See HPI  Objective  Vitals:   05/15/17 1540  BP: 124/68  Pulse: 100  Resp: 16  Temp: 98.8 F (37.1 C)  TempSrc: Oral  SpO2: 97%  Weight: 163 lb (73.9 kg)  Height: 5\' 5"  (1.651 m)   Body mass index is 27.12 kg/m.  Physical Exam Vital signs reviewed. Constitutional: Patient appears well-developed and well-nourished. No distress.  HENT: Head: Normocephalic and atraumatic. Nose: Nose  normal. Mouth/Throat: Oropharynx is clear and moist. No oropharyngeal exudate.  Eyes: Conjunctivae and EOM are normal.  No scleral icterus.  Neck: Normal range of motion. Neck supple. No carotid bruit. Cardiovascular: Normal rate, regular rhythm and normal heart sounds.  No murmur heard. No BLE edema. Distal pulses intact. Pulmonary/Chest: Effort normal and breath sounds normal. No respiratory distress. Abdominal: Soft, no distension. Musculoskeletal: Normal range of motion, no joint effusions. No gross deformities Neurological: He is alert and oriented to person, place, and time.  Coordination, balance, strength, speech and gait are normal.  Skin: Skin is warm and dry. No rash noted. No erythema.  Psychiatric: Patient has a normal mood and  affect. behavior is normal. Judgment and thought content normal.  PHQ2/9: Depression screen PHQ 2/9 04/15/2017  Decreased Interest 0  Down, Depressed, Hopeless 0  PHQ - 2 Score 0   Fall Risk: Fall Risk  04/15/2017  Falls in the past year? No    Assessment & Plan RTC in about 3 months for CPE

## 2017-05-15 NOTE — Patient Instructions (Signed)
Please return to the lab downstairs for labwork when fasting- only water or black coffee for 8 hours prior  Please return in about 3 months for your annual physical.  Keep up the good work with healthy diet and exercise.  It was nice to meet you. Thanks for letting me take care of you today :)   Mediterranean Diet A Mediterranean diet refers to food and lifestyle choices that are based on the traditions of countries located on the [employer] [place]. This way of eating has been shown to help prevent certain conditions and improve outcomes for people who have chronic diseases, like kidney disease and heart disease. What are tips for following this plan? Lifestyle  Cook and eat meals together with your family, when possible.  Drink enough fluid to keep your urine clear or pale yellow.  Be physically active every day. This includes: ? Aerobic exercise like running or swimming. ? Leisure activities like gardening, walking, or housework.  Get 7-8 hours of sleep each night.  If recommended by your health care provider, drink red wine in moderation. This means 1 glass a day for nonpregnant women and 2 glasses a day for men. A glass of wine equals 5 oz (150 mL). Reading food labels  Check the serving size of packaged foods. For foods such as rice and pasta, the serving size refers to the amount of cooked product, not dry.  Check the total fat in packaged foods. Avoid foods that have saturated fat or trans fats.  Check the ingredients list for added sugars, such as corn syrup. Shopping  At the grocery store, buy most of your food from the areas near the walls of the store. This includes: ? Fresh fruits and vegetables (produce). ? Grains, beans, nuts, and seeds. Some of these may be available in unpackaged forms or large amounts (in bulk). ? Fresh seafood. ? Poultry and eggs. ? Low-fat dairy products.  Buy whole ingredients instead of prepackaged foods.  Buy fresh fruits and  vegetables in-season from local farmers markets.  Buy frozen fruits and vegetables in resealable bags.  If you do not have access to quality fresh seafood, buy precooked frozen shrimp or canned fish, such as tuna, salmon, or sardines.  Buy small amounts of raw or cooked vegetables, salads, or olives from the deli or salad bar at your store.  Stock your pantry so you always have certain foods on hand, such as olive oil, canned tuna, canned tomatoes, rice, pasta, and beans. Cooking  Cook foods with extra-virgin olive oil instead of using butter or other vegetable oils.  Have meat as a side dish, and have vegetables or grains as your main dish. This means having meat in small portions or adding small amounts of meat to foods like pasta or stew.  Use beans or vegetables instead of meat in common dishes like chili or lasagna.  Experiment with different cooking methods. Try roasting or broiling vegetables instead of steaming or sauteing them.  Add frozen vegetables to soups, stews, pasta, or rice.  Add nuts or seeds for added healthy fat at each meal. You can add these to yogurt, salads, or vegetable dishes.  Marinate fish or vegetables using olive oil, lemon juice, garlic, and fresh herbs. Meal planning  Plan to eat 1 vegetarian meal one day each week. Try to work up to 2 vegetarian meals, if possible.  Eat seafood 2 or more times a week.  Have healthy snacks readily available, such as: ? Vegetable sticks with  hummus. ? [place] yogurt. ? Fruit and nut trail mix.  Eat balanced meals throughout the week. This includes: ? Fruit: 2-3 servings a day ? Vegetables: 4-5 servings a day ? Low-fat dairy: 2 servings a day ? Fish, poultry, or lean meat: 1 serving a day ? Beans and legumes: 2 or more servings a week ? Nuts and seeds: 1-2 servings a day ? Whole grains: 6-8 servings a day ? Extra-virgin olive oil: 3-4 servings a day  Limit red meat and sweets to only a few servings a  month What are my food choices?  Mediterranean diet ? Recommended ? Grains: Whole-grain pasta. Brown rice. Bulgar wheat. Polenta. Couscous. Whole-wheat bread. Orpah Cobbatmeal. Quinoa. ? Vegetables: Artichokes. Beets. Broccoli. Cabbage. Carrots. Eggplant. Green beans. Chard. Kale. Spinach. Onions. Leeks. Peas. Squash. Tomatoes. Peppers. Radishes. ? Fruits: Apples. Apricots. Avocado. Berries. Bananas. Cherries. Dates. Figs. Grapes. Lemons. Melon. Oranges. Peaches. Plums. Pomegranate. ? Meats and other protein foods: Beans. Almonds. Sunflower seeds. Pine nuts. Peanuts. Cod. Salmon. Scallops. Shrimp. Tuna. Tilapia. Clams. Oysters. Eggs. ? Dairy: Low-fat milk. Cheese. Greek yogurt. ? Beverages: Water. Red wine. Herbal tea. ? Fats and oils: Extra virgin olive oil. Avocado oil. Grape seed oil. ? Sweets and desserts: [place] yogurt with honey. Baked apples. Poached pears. Trail mix. ? Seasoning and other foods: Basil. Cilantro. Coriander. Cumin. Mint. Parsley. Sage. Rosemary. Tarragon. Garlic. Oregano. Thyme. Pepper. Balsalmic vinegar. Tahini. Hummus. Tomato sauce. Olives. Mushrooms. ? Limit these ? Grains: Prepackaged pasta or rice dishes. Prepackaged cereal with added sugar. ? Vegetables: Deep fried potatoes (french fries). ? Fruits: Fruit canned in syrup. ? Meats and other protein foods: Beef. Pork. Lamb. Poultry with skin. Hot dogs. Tomasa BlaseBacon. ? Dairy: Ice cream. Sour cream. Whole milk. ? Beverages: Juice. Sugar-sweetened soft drinks. Beer. Liquor and spirits. ? Fats and oils: Butter. Canola oil. Vegetable oil. Beef fat (tallow). Lard. ? Sweets and desserts: Cookies. Cakes. Pies. Candy. ? Seasoning and other foods: Mayonnaise. Premade sauces and marinades. ? The items listed may not be a complete list. Talk with your dietitian about what dietary choices are right for you. Summary  The Mediterranean diet includes both food and lifestyle choices.  Eat a variety of fresh fruits and vegetables, beans, nuts,  seeds, and whole grains.  Limit the amount of red meat and sweets that you eat.  Talk with your health care provider about whether it is safe for you to drink red wine in moderation. This means 1 glass a day for nonpregnant women and 2 glasses a day for men. A glass of wine equals 5 oz (150 mL). This information is not intended to replace advice given to you by your health care provider. Make sure you discuss any questions you have with your health care provider. Document Released: 11/10/2015 Document Revised: 12/13/2015 Document Reviewed: 11/10/2015 Elsevier Interactive Patient Education  [employer]2018 Elsevier Inc.

## 2017-05-15 NOTE — Assessment & Plan Note (Signed)
-   Lipid panel; Future Discussed the role of healthy diet and exercise in the management of elevated cholesterol-See AVS for education provided to patient

## 2017-05-22 ENCOUNTER — Other Ambulatory Visit (INDEPENDENT_AMBULATORY_CARE_PROVIDER_SITE_OTHER): Payer: 59

## 2017-05-22 DIAGNOSIS — E7800 Pure hypercholesterolemia, unspecified: Secondary | ICD-10-CM | POA: Diagnosis not present

## 2017-05-22 LAB — LIPID PANEL
Cholesterol: 168 mg/dL (ref 0–200)
HDL: 34.9 mg/dL — ABNORMAL LOW
LDL Cholesterol: 110 mg/dL — ABNORMAL HIGH (ref 0–99)
NonHDL: 132.89
Total CHOL/HDL Ratio: 5
Triglycerides: 115 mg/dL (ref 0.0–149.0)
VLDL: 23 mg/dL (ref 0.0–40.0)

## 2017-06-06 ENCOUNTER — Ambulatory Visit (INDEPENDENT_AMBULATORY_CARE_PROVIDER_SITE_OTHER): Payer: 59 | Admitting: Internal Medicine

## 2017-06-06 ENCOUNTER — Encounter: Payer: Self-pay | Admitting: Internal Medicine

## 2017-06-06 VITALS — BP 112/76 | HR 95 | Temp 98.2°F | Ht 65.0 in | Wt 166.0 lb

## 2017-06-06 DIAGNOSIS — J019 Acute sinusitis, unspecified: Secondary | ICD-10-CM | POA: Diagnosis not present

## 2017-06-06 DIAGNOSIS — J309 Allergic rhinitis, unspecified: Secondary | ICD-10-CM | POA: Diagnosis not present

## 2017-06-06 MED ORDER — AZITHROMYCIN 250 MG PO TABS: ORAL_TABLET | ORAL | 1 refills | Status: DC

## 2017-06-06 MED ORDER — PREDNISONE 10 MG PO TABS: ORAL_TABLET | ORAL | 0 refills | Status: DC

## 2017-06-06 MED FILL — AZITHROMYCIN 250 MG TABS: 250 | 5 days supply | Qty: 6 | Fill #0

## 2017-06-06 MED FILL — predniSONE 10 MG TABS: 10 | 9 days supply | Qty: 18 | Fill #0

## 2017-06-06 NOTE — Progress Notes (Signed)
   Subjective:    Patient ID: Janeece FittingReginald L Hendriks, male    DOB: 08/10/1975, 42 y.o.   MRN: [id number]  HPI   Here with 7 days acute onset fever, facial pain, pressure, headache, general weakness and malaise, and greenish bloody d/c, with mild ST and cough, but pt denies chest pain, wheezing, increased sob or doe, orthopnea, PND, increased LE swelling, palpitations, dizziness or syncope.  Does have several wks ongoing nasal allergy symptoms with clearish congestion, itch and sneezing, without fever, pain, ST, cough, swelling or wheezing.   Pt denies polydipsia, polyuria. No other interval hx or new complaints No past medical history on file. Past Surgical History:  Procedure Laterality Date  . KNEE SURGERY  1995   Cartiledge repair    reports that he quit smoking about 8 weeks ago. He has a 12.50 pack-year smoking history. he has never used smokeless tobacco. He reports that he does not drink alcohol or use drugs. family history includes Diabetes in his father and mother; Hypertension in his mother; Stroke in his maternal aunt. No Known Allergies No current outpatient medications on file prior to visit.   No current facility-administered medications on file prior to visit.    Review of Systems  All other system neg per pt    Objective:   Physical Exam BP 112/76   Pulse 95   Temp 98.2 F (36.8 C) (Oral)   Ht 5\' 5"  (1.651 m)   Wt 166 lb (75.3 kg)   SpO2 99%   BMI 27.62 kg/m  VS noted, mild ill  Constitutional: Pt appears in NAD HENT: Head: NCAT.  Right Ear: External ear normal.  Left Ear: External ear normal.  Eyes: . Pupils are equal, round, and reactive to light. Conjunctivae and EOM are normal /Bilat tm's with mild erythema.  Max sinus areas mild tender.  Pharynx with mild erythema, no exudate Nose: without d/c or deformity Neck: Neck supple. Gross normal ROM Cardiovascular: Normal rate and regular rhythm.   Pulmonary/Chest: Effort normal and breath sounds without rales or  wheezing.  Neurological: Pt is alert. At baseline orientation, motor grossly intact Skin: Skin is warm. No rashes, other new lesions, no LE edema Psychiatric: Pt behavior is normal without agitation  No other exam findings    Assessment & Plan:

## 2017-06-06 NOTE — Patient Instructions (Signed)
Please take all new medication as prescribed - the antibiotic, and prednisone  You can also take Delsym OTC for cough, and/or Mucinex (or it's generic off brand) for congestion, and tylenol as needed for pain.  You can also consider OTC Zyrtec and Nasacort for possible year round allergies  Please continue all other medications as before, and refills have been done if requested.  Please have the pharmacy call with any other refills you may need.  Please keep your appointments with your specialists as you may have planned

## 2017-06-09 ENCOUNTER — Encounter: Payer: Self-pay | Admitting: Internal Medicine

## 2017-06-09 DIAGNOSIS — J309 Allergic rhinitis, unspecified: Secondary | ICD-10-CM

## 2017-06-09 DIAGNOSIS — J019 Acute sinusitis, unspecified: Secondary | ICD-10-CM | POA: Insufficient documentation

## 2017-06-09 DIAGNOSIS — J0100 Acute maxillary sinusitis, unspecified: Secondary | ICD-10-CM | POA: Insufficient documentation

## 2017-06-09 HISTORY — DX: Allergic rhinitis, unspecified: J30.9

## 2017-06-09 NOTE — Assessment & Plan Note (Signed)
.  Mild to mod, for predpac asd, then otc zyrtec/nasacort asd,  to f/u any worsening symptoms or concerns

## 2017-06-09 NOTE — Assessment & Plan Note (Signed)
Mild to mod, for antibx course,  to f/u any worsening symptoms or concerns 

## 2017-07-04 ENCOUNTER — Ambulatory Visit: Payer: 59 | Admitting: Nurse Practitioner

## 2017-08-12 ENCOUNTER — Encounter: Payer: 59 | Admitting: Nurse Practitioner

## 2017-08-23 ENCOUNTER — Encounter: Payer: Self-pay | Admitting: Nurse Practitioner

## 2017-08-23 ENCOUNTER — Ambulatory Visit: Payer: Self-pay

## 2017-08-23 ENCOUNTER — Ambulatory Visit (INDEPENDENT_AMBULATORY_CARE_PROVIDER_SITE_OTHER): Payer: 59 | Admitting: Nurse Practitioner

## 2017-08-23 VITALS — BP 122/72 | HR 92 | Temp 98.5°F | Resp 16 | Ht 65.0 in | Wt 176.0 lb

## 2017-08-23 DIAGNOSIS — L509 Urticaria, unspecified: Secondary | ICD-10-CM

## 2017-08-23 DIAGNOSIS — R220 Localized swelling, mass and lump, head: Secondary | ICD-10-CM

## 2017-08-23 MED ORDER — RANITIDINE HCL 150 MG PO TABS: 150.0000 mg | ORAL_TABLET | Freq: Two times a day (BID) | ORAL | 0 refills | Status: DC

## 2017-08-23 MED FILL — raNITIdine HCL 150 MG TABS: 150 | 15 days supply | Qty: 30 | Fill #0

## 2017-08-23 NOTE — Patient Instructions (Addendum)
I have sent a prescription for zantac 193m twice daily for 2 weeks. Please also take benadryl 25 mg every eight hours for the next 3 days.  I have placed a referral to dermatology for your skin rash. Our office will call you to schedule this appointment. You should hear from our office in 7-10 days.  Please follow up if you continue to experience lip swelling after the zantac and benadryl.  Anaphylactic Reaction An anaphylactic reaction (anaphylaxis) is a sudden allergic reaction that is very bad (severe). It also affects more than one part of the body. This condition can be life-threatening. If you have an anaphylactic reaction, you need to get medical help right away. You may need to stay in the hospital. Your doctor may teach you how to use an allergy kit (anaphylaxis kit) and how to give yourself an allergy shot (epinephrine injection). You can give yourself an allergy shot with what is commonly called an auto-injector "pen." Symptoms of an anaphylactic reaction may include:  A stuffy nose (nasal congestion).  Headache.  Tingling in your mouth.  A flushed face.  An itchy, red rash.  Swelling of your eyes, lips, face, or tongue.  Swelling of the back of your mouth and your throat.  Breathing loudly (wheezing).  A hoarse voice.  Itchy, red, swollen areas of skin (hives).  Dizziness or light-headedness.  Passing out (fainting).  Feeling worried or nervous (anxiety).  Feeling confused.  Pain in your belly (abdomen) or chest.  Trouble with breathing, talking, or swallowing.  A tight feeling in your chest or throat.  Fast or uneven heartbeats (palpitations).  Throwing up (vomiting).  Watery poop (diarrhea).  Follow these instructions at home: Safety  Always keep an auto-injector pen or your allergy kit with you. These could save your life. Use them as told by your doctor.  Do not drive until your doctor says that it is safe.  Make sure that you, the people  who live with you, and your employer know: ? How to use your allergy kit. ? How to use an auto-injector pen to give you an allergy shot.  If you used your auto-injector pen: ? Get more medicine for it right away. This is important in case you have another reaction. ? Get help right away.  Wear a bracelet or necklace that says you have an allergy, if your doctor tells you to do this.  Learn the signs of a very bad allergic reaction.  Work with your doctors to make a plan for what to do if you have a very bad allergic reaction. Being prepared is important. General instructions  Take over-the-counter and prescription medicines only as told by your doctor.  If you have itchy, red, swollen areas of skin or a rash: ? Use over-the-counter medicine (antihistamine) as told by your doctor. ? Put cold, wet cloths (cold compresses) on your skin. ? Take baths or showers in cool water. Avoid hot water.  If you had tests done, it is up to you to get your test results. Ask your doctor when your results will be ready.  Tell any doctors who care for you that you have an allergy.  Keep all follow-up visits as told by your doctor. This is important. How is this prevented?  Avoid things (allergens) that gave you a very bad allergic reaction before.  If you have a food allergy and you go to a restaurant, tell your server about your allergy. If you are not sure if your  meal was made with food that you are allergic to, ask your server before you eat it. Contact a doctor if:  You have symptoms of an allergic reaction. You may notice them soon after being around whatever it is that you are allergic to. Symptoms may include: ? A rash. ? A headache. ? Sneezing or a runny nose. ? Swelling. ? Feeling sick to your stomach. ? Watery poop. Get help right away if:  You had to use your auto-injector pen. You must go to the emergency room even if the medicine seems to be working.  You have any of  these: ? A tight feeling in your chest or your throat. ? Loud breathing. ? Trouble with breathing. ? Itchy, red, swollen areas of skin. ? Red skin or itching all over your body. ? Swelling in your lips, tongue, or the back of your throat.  You have throwing up that gets very bad.  You have watery poop that gets very bad.  You pass out or feel like you might pass out. These symptoms may be an emergency. Do not wait to see if the symptoms will go away. Use your auto-injector pen or allergy kit as you have been told. Get medical help right away. Call your local emergency services (911 in the [place] Do not drive yourself to the hospital. Summary  An anaphylactic reaction (anaphylaxis) is a sudden allergic reaction that is very bad (severe).  This condition can be life-threatening. If you have an anaphylactic reaction, you need to get medical help right away.  Your doctor may teach you how to use an allergy kit (anaphylaxis kit) and how to give yourself an allergy shot (epinephrine injection) with an auto-injector "pen."  Always keep an auto-injector pen or your allergy kit with you. These could save your life. Use them as told by your doctor.  If you had to use your auto-injector pen, you must go to the emergency room even if the medicine seems to be working. This information is not intended to replace advice given to you by your health care provider. Make sure you discuss any questions you have with your health care provider. Document Released: 09/05/2007 Document Revised: 11/11/2015 Document Reviewed: 11/11/2015 Elsevier Interactive Patient Education  2017 [employer].

## 2017-08-23 NOTE — Progress Notes (Signed)
Name: Casey Sellers   MRN: [id number]    DOB: November 25, 1975   Date:08/23/2017       Progress Note  Subjective  Chief Complaint  Chief Complaint  Patient presents with  . Rash    rash that flares up when he goes outside in the sun    HPI Mr Vanetten is here today for evaluation of a rash and lip swelling.  The rash is not a new problem. He describes the rash as itchy flesh colored bumps to bilateral arms. He has only noticed the rash to his arms, no other parts of his body He first noticed this rash a few years ago after being outdoors fishing all day. Since then he has noticed the rash to both arms every time he is in the sun He has applied cortisone and other OTC creams to the rash but the rash only seems to be relieved by going indoors, away from the sun. 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 He also reports he woke yesterday with swelling to his upper lip and feels his face is swollen. He does not recall any recent new foods, medications or products. He denies past history of allergic reactions He denies weakness, syncope, dizziness, throat swelling, trouble breathing or swallowing, chest pain, shortness of breath   Patient Active Problem List   Diagnosis Date Noted  . Acute sinus infection 06/09/2017  . Allergic rhinitis 06/09/2017  . Cervical muscle pain 07/24/2016  . Elevated LDL cholesterol level 07/24/2016  . Tobacco use disorder 01/13/2016  . Routine general medical examination at a health care facility 03/05/2014    Social History   Tobacco Use  . Smoking status: Former Smoker    Packs/day: 0.50    Years: 25.00    Pack years: 12.50    Last attempt to quit: 04/14/2017    Years since quitting: 0.3  . Smokeless tobacco: Never Used  Substance Use Topics  . Alcohol use: No    Frequency: Never    Comment: former    No current outpatient medications on file.  No Known Allergies  ROS .   No other specific complaints in a complete review of systems (except as listed in HPI above).  Objective  Vitals:   08/23/17 1301  BP: 122/72  Pulse: 92  Resp: 16  Temp: 98.5 F (36.9 C)  TempSrc: Oral  SpO2: 97%  Weight: 176 lb (79.8 kg)  Height:  (1.651 m)    Body mass index is 29.29 kg/m.  Nursing Note and Vital Signs reviewed.  Physical Exam Vital signs reviewed. Constitutional: Patient appears well-developed and well-nourished. No distress.  HEENT: head atraumatic, normocephalic, pupils equal and reactive to light, EOM's intact,  Mild swelling to upper lip, tongue appears normal, no oropharyngeal edema, oropharynx pink and moist without exudate, trachea midline, neck supple without lymphadenopathy Cardiovascular: Normal rate, regular rhythm, S1/S2 present. No BLE edema. Distal pulses intact Pulmonary/Chest: Effort normal and breath sounds clear. No respiratory distress or retractions. Skin: Warm and dry. Pinpoint flesh colored wheals noted to BUE. Psychiatric: Patient has a normal mood and affect. behavior is normal. Judgment and thought content normal.  Assessment & Plan  1. Urticaria ?suspicious for solar urticaria Discussed referral to dermatology for further E&M Will also initiate treatment with zantac and benadryl for acute symptoms of rash and lip swelling today- dosing and side effects discussed Return precautions discussed and printed on AVS -  ranitidine (ZANTAC) 150 MG tablet; Take 1 tablet (150 mg total) by mouth 2 (two) times daily.  Dispense: 30 tablet; Refill: 0 - Ambulatory referral to Dermatology  2. Swelling of upper lip Will initiate treatment with zantac and benadryl for swelling and rash- dosing and side effects discussed Red flags and when to present for emergency care or RTC including signs of anaphylaxis, fever >101.36F, chest pain, shortness of breath, new/worsening/un-resolving symptoms,  reviewed with patient at time of visit. Follow up  and care instructions discussed and provided in AVS. F/U for new, worsening symptoms or if swelling persists - ranitidine (ZANTAC) 150 MG tablet; Take 1 tablet (150 mg total) by mouth 2 (two) times daily.  Dispense: 30 tablet; Refill: 0

## 2017-08-23 NOTE — Telephone Encounter (Signed)
Pt. Reports he works outside and has noticed his lip swelling and a fine rash to his arms.When he comes inside and takes a shower "it goes away." States he took Benadryl last night "and it helped some." States he has noticed this "on and off for awhile." No pain or itching associated with this.Denies fever or other symptoms. Appointment made for today. Reason for Disposition . [1] Mild facial swelling (puffiness) AND [2] persists > 3 days  Answer Assessment - Initial Assessment Questions 1. ONSET: "When did the swelling start?" (e.g., minutes, hours, days)     Started this week 2. LOCATION: "What part of the face is swollen?"     Top lip is "puffy" 3. SEVERITY: "How swollen is it?"     Mild 4. ITCHING: "Is there any itching?" If so, ask: "How much?"   (Scale 1-10; mild, moderate or severe)     Took Benadryl and it helped 5. PAIN: "Is the swelling painful to touch?" If so, ask: "How painful is it?"   (Scale 1-10; mild, moderate or severe)     No 6. FEVER: "Do you have a fever?" If so, ask: "What is it, how was it measured, and when did it start?"      No 7. CAUSE: "What do you think is causing the face swelling?"     The sun - works outside 8. RECURRENT SYMPTOM: "Have you had face swelling before?" If so, ask: "When was the last time?" "What happened that time?"     Yes- when out in the sun 9. OTHER SYMPTOMS: "Do you have any other symptoms?" (e.g., toothache, leg swelling)     Fine rash to arms 10. PREGNANCY: "Is there any chance you are pregnant?" "When was your last menstrual period?"       No  Protocols used: [facility]

## 2017-08-25 ENCOUNTER — Encounter: Payer: Self-pay | Admitting: Nurse Practitioner

## 2018-02-07 ENCOUNTER — Ambulatory Visit: Payer: 59 | Admitting: Family

## 2018-02-07 ENCOUNTER — Encounter: Payer: Self-pay | Admitting: Family

## 2018-02-07 VITALS — BP 128/74 | HR 81 | Temp 98.4°F | Ht 65.0 in | Wt 183.1 lb

## 2018-02-07 DIAGNOSIS — L304 Erythema intertrigo: Secondary | ICD-10-CM | POA: Diagnosis not present

## 2018-02-07 MED ORDER — NYSTATIN-TRIAMCINOLONE 100000-0.1 UNIT/GM-% EX OINT: 1.0000 "application " | TOPICAL_OINTMENT | Freq: Two times a day (BID) | CUTANEOUS | 0 refills | Status: DC

## 2018-02-07 MED ORDER — FLUCONAZOLE 100 MG PO TABS: 100.0000 mg | ORAL_TABLET | Freq: Every day | ORAL | 0 refills | Status: DC

## 2018-02-07 MED FILL — FLUCONAZOLE 100 MG TAB: 100 | 7 days supply | Qty: 7 | Fill #0

## 2018-02-07 NOTE — Patient Instructions (Addendum)
Try using Dove (Sensitive Skin) instead of [place];     Intertrigo Intertrigo is skin irritation (inflammation) that happens in warm, moist areas of the body. The irritation can cause a rash and make skin raw and itchy. The rash is usually pink or red. It happens mostly between folds of skin or where skin rubs together, such as:  Toes.  Armpits.  Groin.  Belly.  Breasts.  Buttocks.  This condition is not passed from person to person (is not contagious). Follow these instructions at home:  Keep the affected area clean and dry.  Do not scratch your skin.  Stay cool as much as possible. Use an air conditioner or fan, if you can.  Apply over-the-counter and prescription medicines only as told by your doctor.  If you were prescribed an antibiotic medicine, use it as told by your doctor. Do not stop using the antibiotic even if your condition starts to get better.  Keep all follow-up visits as told by your doctor. This is important. How is this prevented?  Stay at a healthy weight.  Keep your feet dry. This is very important if you have diabetes. Wear cotton or wool socks.  Take care of and protect the skin in your groin and butt area as told by your doctor.  Do not wear tight clothes. Wear clothes that: ? Are loose. ? Take away moisture from your body. ? Are made of cotton.  Wear a bra that gives good support, if needed.  Shower and dry yourself fully after being active.  Keep your blood sugar under control if you have diabetes. Contact a doctor if:  Your symptoms do not get better with treatment.  Your symptoms get worse or they spread.  You notice more redness and warmth.  You have a fever. This information is not intended to replace advice given to you by your health care provider. Make sure you discuss any questions you have with your health care provider. Document Released: 04/21/2010 Document Revised: 08/25/2015 Document Reviewed: 09/20/2014 Elsevier  Interactive Patient Education  [employer].

## 2018-02-07 NOTE — Progress Notes (Signed)
  [name] is a 42 y.o. male with the following history as recorded in EpicCare:  Patient Active Problem List   Diagnosis Date Noted  . Acute sinus infection 06/09/2017  . Allergic rhinitis 06/09/2017  . Cervical muscle pain 07/24/2016  . Elevated LDL cholesterol level 07/24/2016  . Tobacco use disorder 01/13/2016  . Routine general medical examination at a health care facility 03/05/2014    Current Outpatient Medications  Medication Sig Dispense Refill  . fluconazole (DIFLUCAN) 100 MG tablet Take 1 tablet (100 mg total) by mouth daily. 7 tablet 0  . nystatin-triamcinolone ointment (MYCOLOG) Apply 1 application topically 2 (two) times daily. 30 g 0  . ranitidine (ZANTAC) 150 MG tablet Take 1 tablet (150 mg total) by mouth 2 (two) times daily. (Patient not taking: Reported on 02/07/2018) 30 tablet 0   No current facility-administered medications for this visit.     Allergies: Patient has no known allergies.  History reviewed. No pertinent past medical history.  Past Surgical History:  Procedure Laterality Date  . KNEE SURGERY  1995   Cartiledge repair    Family History  Problem Relation Age of Onset  . Hypertension Mother   . Diabetes Mother   . Diabetes Father   . Stroke Maternal Aunt     Social History   Tobacco Use  . Smoking status: Former Smoker    Packs/day: 0.50    Years: 25.00    Pack years: 12.50    Last attempt to quit: 04/14/2017    Years since quitting: 0.8  . Smokeless tobacco: Never Used  Substance Use Topics  . Alcohol use: No    Frequency: Never    Comment: former    Subjective:  Presents with concerns for fungal infection/ jock itch; no relief with OTC treatments; works outside for the [employer]- does sweat; notes that his groin area just stays irritated/ itchy especially at night.   Objective:  Vitals:   02/07/18 1050  BP: 128/74  Pulse: 81  Temp: 98.4 F (36.9 C)  TempSrc: Oral  SpO2: 97%  Weight: 183 lb 1.3 oz (83 kg)   Height: 5\' 5"  (1.651 m)    General: Well developed, well nourished, in no acute distress  Skin : Warm and dry. Mild erythema noted in bilateral groin areas; no changes consistent with tinea infection.  Head: Normocephalic and atraumatic  Eyes: Sclera and conjunctiva clear; pupils round and reactive to light; extraocular movements intact  Ears: External normal; canals clear; tympanic membranes normal  Oropharynx: Pink, supple. No suspicious lesions  Neck: Supple without thyromegaly, adenopathy  Lungs: Respirations unlabored;  Neurologic: Alert and oriented; speech intact; face symmetrical; moves all extremities well; CNII-XII intact without focal deficit   Assessment:  1. Intertrigo     Plan:  Reassurance; do not feel this is tinea infection; will treat with combination of Diflucan and Mycolog; encouraged to keep skin dry to prevent recurrence; would also recommend that he stop using [place] soap and change to something for sensitive skin like Dove. Follow-up worse, no better.   No follow-ups on file.  No orders of the defined types were placed in this encounter.   Requested Prescriptions   Signed Prescriptions Disp Refills  . fluconazole (DIFLUCAN) 100 MG tablet 7 tablet 0    Sig: Take 1 tablet (100 mg total) by mouth daily.  Marland Kitchen nystatin-triamcinolone ointment (MYCOLOG) 30 g 0    Sig: Apply 1 application topically 2 (two) times daily.

## 2018-03-31 ENCOUNTER — Ambulatory Visit: Payer: Self-pay | Admitting: *Deleted

## 2018-03-31 NOTE — Telephone Encounter (Signed)
Pt reports recurrent itching at groin, bilaterally,onset Friday. Denies rash, no other symptoms. Saw A. Shambley 02/07/18, Rxed Diflucan. Pt requesting med be called in again. States "I work outside so this reoccurs." Reports diflucan resolved previous episode completely. States has been following all recommendations from Ms. Shambley re: sensitive skin.  If appropriate: [facility].    Answer Assessment - Initial Assessment Questions 1. DESCRIPTION: "Describe the itching you are having." "Where is it located?"     Groin, bilateral 2. SEVERITY: "How bad is it?"    - MILD - doesn't interfere with normal activities   - MODERATE - SEVERE: interferes with work, school, sleep, or other activities      moderate 3. SCRATCHING: "Are there any scratch marks? Bleeding?"     no 4. ONSET: "When did the itching begin?"      Friday 5. CAUSE: "What do you think is causing the itching?"      yeast 6. OTHER SYMPTOMS: "Do you have any other symptoms?"      no  Protocols used: ITCHING - LOCALIZED-A-AH

## 2018-04-01 ENCOUNTER — Telehealth: Payer: Self-pay | Admitting: *Deleted

## 2018-04-01 NOTE — Telephone Encounter (Signed)
Pt informed of below.  

## 2018-04-01 NOTE — Telephone Encounter (Signed)
He was given cream by Ria ClockLaura Murray about 1 month ago. Is he using this?

## 2018-04-01 NOTE — Telephone Encounter (Signed)
Will you advise in PCP's absence? Thanks!

## 2018-04-19 ENCOUNTER — Ambulatory Visit: Payer: Self-pay | Admitting: Nurse Practitioner

## 2018-04-19 VITALS — BP 122/82 | HR 82 | Temp 98.4°F | Wt 181.4 lb

## 2018-04-19 DIAGNOSIS — B356 Tinea cruris: Secondary | ICD-10-CM

## 2018-04-19 MED ORDER — MICONAZOLE NITRATE 2 % EX CREA: 1.0000 "application " | TOPICAL_CREAM | Freq: Two times a day (BID) | CUTANEOUS | 0 refills | Status: AC

## 2018-04-19 MED ORDER — FLUCONAZOLE 150 MG PO TABS: 150.0000 mg | ORAL_TABLET | Freq: Once | ORAL | 0 refills | Status: AC

## 2018-04-19 NOTE — Progress Notes (Signed)
Subjective:     Casey Sellers is a 43 y.o. male who presents for evaluation of a rash involving the scrotum, testicles and groin. Rash started 3 weeks ago. Lesions both flat and raised in texture. Rash has changed over time. Rash is pruritic and sometimes has burning sensation. Associated symptoms: none. Patient denies: fever, sore throat and vomiting. Patient has had contacts with similar rash. Patient has not had new exposures (soaps, lotions, laundry detergents, foods, medications, plants, insects or animals).  The patient was seen back in November for the same symptoms.  He was given Diflucan and Mycolog cream at that time.  Patient states the Diflucan did help, but he never had the prescription for the cream filled.  Patient states over the last couple weeks, he has had increased itching.  Patient states that itching is worse at night and when he becomes hot and sweaty in those areas.  Patient denies any risk of STD or urinary symptoms.  The following portions of the patient's history were reviewed and updated as appropriate: allergies, current medications and past medical history.  Review of Systems Constitutional: negative Ears, nose, mouth, throat, and face: negative Respiratory: negative Cardiovascular: negative Gastrointestinal: negative Genitourinary:positive for rash, negative for genital lesions, penile discharge and sexual problems, dysuria, frequency, hematuria, hesitancy and nocturia Integument/breast: positive for genital rash, itching, negative for skin lesion(s)    Objective:    BP 122/82 (BP Location: Right Arm, Patient Position: Sitting, Cuff Size: Normal)   Pulse 82   Temp 98.4 F (36.9 C) (Oral)   Wt 181 lb 6.4 oz (82.3 kg)   SpO2 96%   BMI 30.19 kg/m  General:  alert and cooperative  Skin:  Unable to perform visual/physical assessment due to no chaperone. Diagnosis based on HPI, ROS, no visible rash to face or other areas of the body     Assessment:    tinea  cruris    Plan:   Exam findings, diagnosis etiology and medication use and indications reviewed with patient. Follow- Up and discharge instructions provided. No emergent/urgent issues found on exam.  Based on the patient's clinical presentation, symptoms, had to make a diagnosis due to inability to have chaperone available during exam.  Informed patient that we will repeat the treatment that worked for him prior which was a Diflucan, and also added miconazole cream topically.  Informed patient that if symptoms do not improve with this treatment regimen, he will need to follow-up with his PCP or dermatology to determine why he is having this recurrence.  Patient education was provided. Patient verbalized understanding of information provided and agrees with plan of care (POC), all questions answered. The patient is advised to call or return to clinic if condition does not see an improvement in symptoms, or to seek the care of the closest emergency department if condition worsens with the above plan.   1. Tinea cruris  - miconazole (MICATIN) 2 % cream; Apply 1 application topically 2 (two) times daily for 14 days. Apply twice daily until symptoms improve.  Dispense: 42.5 g; Refill: 0 - fluconazole (DIFLUCAN) 150 MG tablet; Take 1 tablet (150 mg total) by mouth once for 1 dose.  Dispense: 7 tablet; Refill: 0 -Take medication as prescribed. -Consider changing the type of underwear that you are using to to some that will promote dryness and prevent moisture. -Avoid scratching the area if possible. -Good hand hygiene. -Avoid hot baths and showers while symptoms persist. -If there is no improvement of your symptoms  with the prescribed medications, I would like for you to follow-up with dermatology to determine if the previous treatments have been appropriate and to determine the cause of your symptoms.

## 2018-04-19 NOTE — Patient Instructions (Signed)
Jock Itch -Take medication as prescribed. -Consider changing the type of underwear that you are using to to some that will promote dryness and prevent moisture. -Avoid scratching the area if possible. -Good hand hygiene. -Avoid hot baths and showers while symptoms persist. -If there is no improvement of your symptoms with the prescribed medications, I would like for you to follow-up with dermatology to determine if the previous treatments have been appropriate and to determine the cause of your symptoms.   Jock itch (tinea cruris) is an infection of the skin in the groin area. It is caused by a fungus, which is a type of germ that lives in dark, damp places. Jock itch causes an itchy rash in the groin and upper thigh area. It usually goes away in 2-3 weeks with treatment. What are the causes? The fungus that causes jock itch may be spread by:  Touching a fungus infection elsewhere on your body, such as athlete's foot, and then touching your groin area.  Sharing towels or clothing, such as socks or shoes, with someone who has a fungal infection. What increases the risk? Jock itch is most common in men and adolescent boys. You are also more likely to develop the condition if you:  Are in a hot, humid climate.  Wear tight-fitting clothing or wet bathing suits for long periods of time.  Play sports.  Are overweight.  Have diabetes.  Have a weakened immune system.  Sweat a lot. What are the signs or symptoms? Symptoms of jock itch may include:  A red, pink, or brown rash in the groin area. Blisters may be present. The rash may spread to the thighs, anus, and buttocks.  Dry and scaly skin on or around the rash.  Itchiness. How is this diagnosed? In most cases, your health care provider can make the diagnosis by looking at your rash. In some cases, a sample of infected skin may be scraped off. This sample may be examined under a microscope (biopsy) or by trying to grow the fungus  from the sample (culture). How is this treated? Treatment for this condition may include:  Antifungal medicine to kill the fungus. This may be a skin cream, ointment, or powder, or it may be a medicine that you take by mouth.  Skin cream or ointment to reduce itching.  Lifestyle changes, such as wearing looser clothing and caring for your skin. Follow these instructions at home: Skin care  Apply skin creams, ointments, or powders exactly as told by your health care provider.  Wear loose-fitting clothing that does not rub against your groin area. Men should wear boxer shorts or loose-fitting underwear.  Keep your groin area clean and dry. ? Change your underwear every day. ? Change out of wet bathing suits as soon as possible. ? After bathing, use a separate towel to dry your groin area thoroughly and gently. Using a separate towel will help prevent spreading the infection to other areas of your body.  Avoid hot baths and showers. Hot water can make itching worse.  Do not scratch the affected area. General instructions  Take and apply over-the-counter and prescription medicines only as told by your health care provider.  Do not share towels, clothing, or personal items with other people.  Wash your hands often with soap and water, especially after touching your groin area. If soap and water are not available, use alcohol-based hand sanitizer. Contact a health care provider if:  Your rash: ? Gets worse or does not get  better after 2 weeks of treatment. ? Spreads. ? Returns after treatment is finished.  You have any of the following: ? A fever. ? New or worsening redness, swelling, or pain around your rash. ? Fluid, blood, or pus coming from your rash. Summary  Jock itch (tinea cruris) is a fungal infection of the skin in the groin area.  The fungus can be spread by sharing clothing or by touching a fungus infection elsewhere on your body and then touching your groin  area.  Treatment may include antifungal medicine and lifestyle changes, such as keeping the area clean and dry. This information is not intended to replace advice given to you by your health care provider. Make sure you discuss any questions you have with your health care provider. Document Released: 03/09/2002 Document Revised: 02/27/2017 Document Reviewed: 02/27/2017 Elsevier Interactive Patient Education  2019 [employer].

## 2018-05-30 ENCOUNTER — Encounter: Payer: Self-pay | Admitting: Internal Medicine

## 2018-05-30 ENCOUNTER — Ambulatory Visit: Payer: 59 | Admitting: Internal Medicine

## 2018-05-30 DIAGNOSIS — J0110 Acute frontal sinusitis, unspecified: Secondary | ICD-10-CM | POA: Diagnosis not present

## 2018-05-30 MED ORDER — FLUTICASONE PROPIONATE 50 MCG/ACT NA SUSP: 2.0000 | Freq: Every day | NASAL | 6 refills | Status: DC

## 2018-05-30 MED ORDER — AMOXICILLIN-POT CLAVULANATE 875-125 MG PO TABS: 1.0000 | ORAL_TABLET | Freq: Two times a day (BID) | ORAL | 0 refills | Status: DC

## 2018-05-30 MED FILL — FLUTICASONE PROP 50 MCG SPR: 50 | 30 days supply | Qty: 16 | Fill #0

## 2018-05-30 MED FILL — AMOX-CLAV 875-125 MG TABLET: 875-125 | 10 days supply | Qty: 20 | Fill #0

## 2018-05-30 NOTE — Progress Notes (Signed)
   Subjective:   Patient ID: Casey Sellers, male    DOB: [birth year], 43 y.o.   MRN: [id number]  HPI The patient is a 43 y.o. man coming in for cold symptoms. Started about 3 weeks ago. Main symptoms are: sinus pain/pressure, congestion, drainage, yellow mucus. Denies cough or SOB. Overall it is worsening. Did have sick contacts at the onset. Has tried otc allergy and nose spray that you can use for 3 days and then need to stop. Still taking allergy medication. Denies fevers or chills.   Review of Systems  Constitutional: Positive for activity change, appetite change and fatigue. Negative for chills, fever and unexpected weight change.  HENT: Positive for congestion, ear pain, postnasal drip, rhinorrhea and sinus pressure. Negative for ear discharge, sinus pain, sneezing, sore throat, tinnitus, trouble swallowing and voice change.   Eyes: Negative.   Respiratory: Positive for cough. Negative for chest tightness, shortness of breath and wheezing.   Cardiovascular: Negative.   Gastrointestinal: Negative.   Musculoskeletal: Positive for myalgias.  Neurological: Negative.     Objective:  Physical Exam Constitutional:      Appearance: He is well-developed.  HENT:     Head: Normocephalic and atraumatic.     Comments: Oropharynx with redness and clear drainage, nose with swollen turbinates, TMs normal bilaterally. Frontal sinus pressure.  Neck:     Musculoskeletal: Normal range of motion.     Thyroid: No thyromegaly.  Cardiovascular:     Rate and Rhythm: Normal rate and regular rhythm.  Pulmonary:     Effort: Pulmonary effort is normal. No respiratory distress.     Breath sounds: Normal breath sounds. No wheezing or rales.  Abdominal:     Palpations: Abdomen is soft.  Musculoskeletal:        General: Tenderness present.  Lymphadenopathy:     Cervical: No cervical adenopathy.  Skin:    General: Skin is warm and dry.  Neurological:     Mental Status: He is alert and oriented to  person, place, and time.     Vitals:   05/30/18 0814  BP: 130/80  Pulse: 99  Temp: 98.1 F (36.7 C)  TempSrc: Oral  SpO2: 97%  Weight: 183 lb (83 kg)  Height: 5\' 5"  (1.651 m)    Assessment & Plan:

## 2018-05-30 NOTE — Patient Instructions (Signed)
We have sent in augmentin to take 1 pill twice a day for 10 days.   We have also sent in a nose spray to use 2 sprays in each nose daily to help the drainage.

## 2018-05-30 NOTE — Assessment & Plan Note (Signed)
Rx for augmentin and flonase. Encouraged to continue oral allergy medication as well.

## 2019-11-02 ENCOUNTER — Other Ambulatory Visit: Payer: Self-pay

## 2019-11-02 ENCOUNTER — Ambulatory Visit (HOSPITAL_COMMUNITY)
Admission: EM | Admit: 2019-11-02 | Discharge: 2019-11-02 | Disposition: A | Discharge status: Home / Self Care | Payer: 59 | Attending: Family Medicine | Admitting: Family Medicine

## 2019-11-02 ENCOUNTER — Encounter (HOSPITAL_COMMUNITY): Payer: Self-pay | Admitting: Emergency Medicine

## 2019-11-02 DIAGNOSIS — R0981 Nasal congestion: Secondary | ICD-10-CM | POA: Diagnosis present

## 2019-11-02 DIAGNOSIS — Z79899 Other long term (current) drug therapy: Secondary | ICD-10-CM | POA: Insufficient documentation

## 2019-11-02 DIAGNOSIS — J069 Acute upper respiratory infection, unspecified: Secondary | ICD-10-CM | POA: Insufficient documentation

## 2019-11-02 DIAGNOSIS — R519 Headache, unspecified: Secondary | ICD-10-CM | POA: Diagnosis not present

## 2019-11-02 DIAGNOSIS — Z87891 Personal history of nicotine dependence: Secondary | ICD-10-CM | POA: Insufficient documentation

## 2019-11-02 DIAGNOSIS — R05 Cough: Secondary | ICD-10-CM | POA: Diagnosis not present

## 2019-11-02 DIAGNOSIS — Z20822 Contact with and (suspected) exposure to covid-19: Secondary | ICD-10-CM | POA: Insufficient documentation

## 2019-11-02 DIAGNOSIS — R079 Chest pain, unspecified: Secondary | ICD-10-CM | POA: Insufficient documentation

## 2019-11-02 LAB — SARS CORONAVIRUS 2 (TAT 6-24 HRS): SARS Coronavirus 2: NEGATIVE

## 2019-11-02 MED ORDER — ALBUTEROL SULFATE HFA 108 (90 BASE) MCG/ACT IN AERS: 1.0000 | INHALATION_SPRAY | Freq: Four times a day (QID) | RESPIRATORY_TRACT | 0 refills | Status: DC | PRN

## 2019-11-02 MED ORDER — PREDNISONE 20 MG PO TABS: 40.0000 mg | ORAL_TABLET | Freq: Every day | ORAL | 0 refills | Status: AC

## 2019-11-02 MED ORDER — AZITHROMYCIN 250 MG PO TABS
ORAL_TABLET | ORAL | 0 refills | Status: AC
Start: 2019-11-02 — End: 2019-11-07

## 2019-11-02 NOTE — ED Provider Notes (Signed)
[facility]    CSN: [id number] Arrival date & time: 11/02/19  5573      History   Chief Complaint Chief Complaint  Patient presents with  . Nasal Congestion    HPI Casey Sellers is a 44 y.o. male.   Casey Sellers presents with complaints of  Cough, congestion, headache, chest pain with cough, low grade temps. Has gotten his covid vaccine series. Symptoms started 7/29. Yellow and beige drainage from nose and with coughing. Able to produce more mucus when he wakes up. Coughing is keeping him up at night. He has had a few loose stools but no diarrhea. No nausea or vomiting. Decreased appetite. His daughter has had some congestion as well. Has been taking mucinex dm which hasn't helped. He has taken tylenol as well as theraflu, which also haven't helped. Sometimes he feels shortness of breath. Smokes cigarettes. No history of asthma or copd. Has had bronchitis in the past which has felt similar.    ROS per HPI, negative if not otherwise mentioned.      History reviewed. No pertinent past medical history.  Patient Active Problem List   Diagnosis Date Noted  . Acute sinus infection 06/09/2017  . Allergic rhinitis 06/09/2017  . Cervical muscle pain 07/24/2016  . Elevated LDL cholesterol level 07/24/2016  . Tobacco use disorder 01/13/2016  . Routine general medical examination at a health care facility 03/05/2014    Past Surgical History:  Procedure Laterality Date  . KNEE SURGERY  1995   Cartiledge repair       Home Medications    Prior to Admission medications   Medication Sig Start Date End Date Taking? Authorizing Provider  albuterol (PROAIR HFA) 108 (90 Base) MCG/ACT inhaler Inhale 1-2 puffs into the lungs every 6 (six) hours as needed for wheezing or shortness of breath. 11/02/19   Georgetta Haber, NP  azithromycin (ZITHROMAX) 250 MG tablet Take 2 tablets (500 mg total) by mouth daily for 1 day, THEN 1 tablet (250 mg total) daily for 4 days. 11/02/19  11/07/19  Georgetta Haber, NP  predniSONE (DELTASONE) 20 MG tablet Take 2 tablets (40 mg total) by mouth daily with breakfast for 5 days. 11/02/19 11/07/19  Georgetta Haber, NP  fluticasone (FLONASE) 50 MCG/ACT nasal spray Place 2 sprays into both nostrils daily. 05/30/18 11/02/19  Myrlene Broker, MD    Family History Family History  Problem Relation Age of Onset  . Hypertension Mother   . Diabetes Mother   . Diabetes Father   . Stroke Maternal Aunt     Social History Social History   Tobacco Use  . Smoking status: Former Smoker    Packs/day: 0.50    Years: 25.00    Pack years: 12.50    Quit date: 04/14/2017    Years since quitting: 2.5  . Smokeless tobacco: Never Used  Substance Use Topics  . Alcohol use: No    Comment: former  . Drug use: No     Allergies   Patient has no known allergies.   Review of Systems Review of Systems   Physical Exam Triage Vital Signs ED Triage Vitals  Enc Vitals Group     BP 11/02/19 1024 (!) 139/79     Pulse Rate 11/02/19 1024 99     Resp 11/02/19 1024 18     Temp 11/02/19 1024 99 F (37.2 C)     Temp Source 11/02/19 1024 Oral     SpO2 11/02/19 1024  98 %     Weight --      Height --      Head Circumference --      Peak Flow --      Pain Score 11/02/19 1026 4     Pain Loc --      Pain Edu? --      Excl. in GC? --    No data found.  Updated Vital Signs BP (!) 139/79 (BP Location: Right Arm)   Pulse 99   Temp 99 F (37.2 C) (Oral)   Resp 18   SpO2 98%   Visual Acuity Right Eye Distance:   Left Eye Distance:   Bilateral Distance:    Right Eye Near:   Left Eye Near:    Bilateral Near:     Physical Exam Constitutional:      Appearance: He is well-developed.  HENT:     Head: Normocephalic and atraumatic.     Mouth/Throat:     Mouth: Mucous membranes are moist.  Eyes:     Extraocular Movements: Extraocular movements intact.     Pupils: Pupils are equal, round, and reactive to light.  Cardiovascular:      Rate and Rhythm: Normal rate.  Pulmonary:     Effort: Pulmonary effort is normal.     Breath sounds: Wheezing present.     Comments: Light wheezing with deep inspiration noted; strong coarse cough noted  Skin:    General: Skin is warm and dry.  Neurological:     Mental Status: He is alert and oriented to person, place, and time.      UC Treatments / Results  Labs (all labs ordered are listed, but only abnormal results are displayed) Labs Reviewed  SARS CORONAVIRUS 2 (TAT 6-24 HRS)    EKG   Radiology No results found.  Procedures Procedures (including critical care time)  Medications Ordered in UC Medications - No data to display  Initial Impression / Assessment and Plan / UC Course  I have reviewed the triage vital signs and the nursing notes.  Pertinent labs & imaging results that were available during my care of the patient were reviewed by me and considered in my medical decision making (see chart for details).     Non toxic. Benign physical exam. Frequent cough. No work of breathing or tachypnea. Afebrile here today. covid vaccinated. Smokes. prednisone and inhaler provided, zpack to have and hold provided, pending covid testing. Return precautions provided. Patient verbalized understanding and agreeable to plan. Return precautions provided.   Final Clinical Impressions(s) / UC Diagnoses   Final diagnoses:  Upper respiratory tract infection, unspecified type     Discharge Instructions     Push fluids to ensure adequate hydration and keep secretions thin.  Tylenol and/or ibuprofen as needed for pain or fevers.  Over the counter medications as needed for symptoms such as mucinex d.  Use of inhaler as needed for wheezing or shortness of breath.   Course of prednisone as prescribed. Continue to decrease to quit smoking as this can worsen your symptoms.  Azithromycin, wait until your covid test is back, if it is positive no need to take azithromycin. If it is  negative and symptoms persist you may take.  Self isolate until covid results are back and negative.  Will notify you by phone of any positive findings. Your negative results will be sent through your MyChart.     If symptoms worsen or do not improve in the next week to  return to be seen or to follow up with your PCP.      ED Prescriptions    Medication Sig Dispense Auth. Provider   predniSONE (DELTASONE) 20 MG tablet Take 2 tablets (40 mg total) by mouth daily with breakfast for 5 days. 10 tablet Linus Mako B, NP   albuterol (PROAIR HFA) 108 (90 Base) MCG/ACT inhaler Inhale 1-2 puffs into the lungs every 6 (six) hours as needed for wheezing or shortness of breath. 8 g Linus Mako B, NP   azithromycin (ZITHROMAX) 250 MG tablet Take 2 tablets (500 mg total) by mouth daily for 1 day, THEN 1 tablet (250 mg total) daily for 4 days. 6 tablet Georgetta Haber, NP     PDMP not reviewed this encounter.   Georgetta Haber, NP 11/02/19 1147

## 2019-11-02 NOTE — Discharge Instructions (Signed)
Push fluids to ensure adequate hydration and keep secretions thin.  Tylenol and/or ibuprofen as needed for pain or fevers.  Over the counter medications as needed for symptoms such as mucinex d.  Use of inhaler as needed for wheezing or shortness of breath.   Course of prednisone as prescribed. Continue to decrease to quit smoking as this can worsen your symptoms.  Azithromycin, wait until your covid test is back, if it is positive no need to take azithromycin. If it is negative and symptoms persist you may take.  Self isolate until covid results are back and negative.  Will notify you by phone of any positive findings. Your negative results will be sent through your MyChart.     If symptoms worsen or do not improve in the next week to return to be seen or to follow up with your PCP.

## 2019-11-02 NOTE — ED Triage Notes (Signed)
Pt c/o of nasal congestion, clear drainage since Thursday. Drainage is not thick yellow, c/o of headache, cough, low grade fever.  States took OTC medication with minimal relief.

## 2020-03-08 ENCOUNTER — Ambulatory Visit: Payer: 59

## 2020-03-17 ENCOUNTER — Telehealth: Payer: Self-pay

## 2020-03-17 NOTE — Telephone Encounter (Signed)
   Are you okay with Mr Fancher being assigned as your patient?

## 2020-03-18 NOTE — Telephone Encounter (Signed)
That's okay or if he wants a male, he can see Dr. Yetta Barre.

## 2020-04-08 ENCOUNTER — Other Ambulatory Visit: Payer: Self-pay

## 2020-04-08 ENCOUNTER — Encounter: Payer: Self-pay | Admitting: Family

## 2020-04-08 ENCOUNTER — Ambulatory Visit: Payer: 59 | Admitting: Family

## 2020-04-08 ENCOUNTER — Ambulatory Visit (INDEPENDENT_AMBULATORY_CARE_PROVIDER_SITE_OTHER): Payer: 59

## 2020-04-08 VITALS — BP 130/88 | HR 92 | Temp 98.8°F | Ht 65.0 in | Wt 175.0 lb

## 2020-04-08 DIAGNOSIS — G8929 Other chronic pain: Secondary | ICD-10-CM | POA: Diagnosis not present

## 2020-04-08 DIAGNOSIS — Z0279 Encounter for issue of other medical certificate: Secondary | ICD-10-CM

## 2020-04-08 DIAGNOSIS — M25562 Pain in left knee: Secondary | ICD-10-CM

## 2020-04-08 MED ORDER — MELOXICAM 15 MG PO TABS
15.0000 mg | ORAL_TABLET | Freq: Every day | ORAL | 0 refills | Status: DC
Start: 2020-04-08 — End: 2022-02-16

## 2020-04-08 NOTE — Progress Notes (Signed)
Casey Sellers is a 45 y.o. male with the following history as recorded in [employer]:  Patient Active Problem List   Diagnosis Date Noted  . Acute sinus infection 06/09/2017  . Allergic rhinitis 06/09/2017  . Cervical muscle pain 07/24/2016  . Elevated LDL cholesterol level 07/24/2016  . Tobacco use disorder 01/13/2016  . Routine general medical examination at a health care facility 03/05/2014    Current Outpatient Medications  Medication Sig Dispense Refill  . acetaminophen (TYLENOL) 500 MG tablet Take 500 mg by mouth every 6 (six) hours as needed.    . meloxicam (MOBIC) 15 MG tablet Take 1 tablet (15 mg total) by mouth daily. 30 tablet 0   No current facility-administered medications for this visit.    Allergies: Red dye  No past medical history on file.  Past Surgical History:  Procedure Laterality Date  . KNEE SURGERY  1995   Cartiledge repair    Family History  Problem Relation Age of Onset  . Hypertension Mother   . Diabetes Mother   . Diabetes Father   . Stroke Maternal Aunt     Social History   Tobacco Use  . Smoking status: Former Smoker    Packs/day: 0.50    Years: 25.00    Pack years: 12.50    Quit date: 04/14/2017    Years since quitting: 2.9  . Smokeless tobacco: Never Used  Substance Use Topics  . Alcohol use: No    Comment: former    Subjective:   Patient "broke" his kneecap in 1995- had "surgery" but now having increased pain in the knee; symptoms most noticeable during the cold; is concerned about losing his job due to having to call out due to the pain; having to call out once every 2 weeks due to the pain; wakes up and knee feels "swollen and stiff"- can take up to 20-30 minutes to "loosen up." Requesting FMLA for job protection at this time;     Objective:  Vitals:   04/08/20 1047  BP: 130/88  Pulse: 92  Temp: 98.8 F (37.1 C)  TempSrc: Oral  SpO2: 98%  Weight: 175 lb (79.4 kg)  Height: 5\' 5"  (1.651 m)    General: Well developed,  well nourished, in no acute distress  Skin : Warm and dry.  Head: Normocephalic and atraumatic  Lungs: Respirations unlabored;  Musculoskeletal: No deformities; swelling noted over left knee Extremities: No edema, cyanosis, clubbing  Vessels: Symmetric bilaterally  Neurologic: Alert and oriented; speech intact; face symmetrical; moves all extremities well; CNII-XII intact without focal deficit   Assessment:  1. Chronic pain of left knee     Plan:  Update X-ray of knee; Rx for Mobic 15 mg qd; refer to orthopedist for continue care; short term FMLA provided until he can establish with orthopedist;   Time spent 30 minutes coordinating care and discussing treatment options  Plan to return for CPE at later date;  This visit occurred during the SARS-CoV-2 public health emergency.  Safety protocols were in place, including screening questions prior to the visit, additional usage of staff PPE, and extensive cleaning of exam room while observing appropriate contact time as indicated for disinfecting solutions.     No follow-ups on file.  Orders Placed This Encounter  Procedures  . DG Knee Complete 4 Views Left    Standing Status:   Future    Number of Occurrences:   1    Standing Expiration Date:   04/08/2021    Order  Specific Question:   Reason for Exam (SYMPTOM  OR DIAGNOSIS REQUIRED)    Answer:   knee pain    Order Specific Question:   Preferred imaging location?    Answer:   [facility]  . Ambulatory referral to Orthopedic Surgery    Referral Priority:   Routine    Referral Type:   Surgical    Referral Reason:   Specialty Services Required    Requested Specialty:   Orthopedic Surgery    Number of Visits Requested:   1    Requested Prescriptions   Signed Prescriptions Disp Refills  . meloxicam (MOBIC) 15 MG tablet 30 tablet 0    Sig: Take 1 tablet (15 mg total) by mouth daily.

## 2020-04-19 ENCOUNTER — Other Ambulatory Visit: Payer: 59

## 2020-04-20 ENCOUNTER — Other Ambulatory Visit: Payer: 59

## 2020-04-20 ENCOUNTER — Other Ambulatory Visit: Payer: Self-pay

## 2020-04-20 DIAGNOSIS — Z20822 Contact with and (suspected) exposure to covid-19: Secondary | ICD-10-CM

## 2020-04-21 ENCOUNTER — Other Ambulatory Visit: Payer: 59

## 2020-04-22 LAB — NOVEL CORONAVIRUS, NAA: SARS-CoV-2, NAA: NOT DETECTED

## 2020-04-22 LAB — SARS-COV-2, NAA 2 DAY TAT

## 2020-08-18 ENCOUNTER — Ambulatory Visit (HOSPITAL_COMMUNITY)
Admission: EM | Admit: 2020-08-18 | Discharge: 2020-08-18 | Disposition: A | Discharge status: Home / Self Care | Payer: 59 | Attending: Internal Medicine | Admitting: Internal Medicine

## 2020-08-18 ENCOUNTER — Encounter (HOSPITAL_COMMUNITY): Payer: Self-pay

## 2020-08-18 ENCOUNTER — Ambulatory Visit (INDEPENDENT_AMBULATORY_CARE_PROVIDER_SITE_OTHER): Payer: 59

## 2020-08-18 ENCOUNTER — Other Ambulatory Visit: Payer: Self-pay

## 2020-08-18 DIAGNOSIS — E785 Hyperlipidemia, unspecified: Secondary | ICD-10-CM | POA: Diagnosis not present

## 2020-08-18 DIAGNOSIS — Z20822 Contact with and (suspected) exposure to covid-19: Secondary | ICD-10-CM | POA: Insufficient documentation

## 2020-08-18 DIAGNOSIS — R109 Unspecified abdominal pain: Secondary | ICD-10-CM | POA: Insufficient documentation

## 2020-08-18 DIAGNOSIS — R519 Headache, unspecified: Secondary | ICD-10-CM

## 2020-08-18 DIAGNOSIS — M549 Dorsalgia, unspecified: Secondary | ICD-10-CM

## 2020-08-18 DIAGNOSIS — Z8249 Family history of ischemic heart disease and other diseases of the circulatory system: Secondary | ICD-10-CM | POA: Insufficient documentation

## 2020-08-18 DIAGNOSIS — R079 Chest pain, unspecified: Secondary | ICD-10-CM | POA: Insufficient documentation

## 2020-08-18 DIAGNOSIS — Z87891 Personal history of nicotine dependence: Secondary | ICD-10-CM | POA: Diagnosis not present

## 2020-08-18 DIAGNOSIS — Z791 Long term (current) use of non-steroidal anti-inflammatories (NSAID): Secondary | ICD-10-CM | POA: Insufficient documentation

## 2020-08-18 LAB — POCT URINALYSIS DIPSTICK, ED / UC
Glucose, UA: NEGATIVE mg/dL
Ketones, ur: NEGATIVE mg/dL
Leukocytes,Ua: NEGATIVE
Nitrite: NEGATIVE
Protein, ur: 30 mg/dL — AB
Specific Gravity, Urine: 1.025 (ref 1.005–1.030)
Urobilinogen, UA: 1 mg/dL (ref 0.0–1.0)
pH: 5.5 (ref 5.0–8.0)

## 2020-08-18 LAB — SARS CORONAVIRUS 2 (TAT 6-24 HRS): SARS Coronavirus 2: NEGATIVE

## 2020-08-18 NOTE — ED Provider Notes (Signed)
[facility]    CSN: [id number] Arrival date & time: 08/18/20  0801      History   Chief Complaint Chief Complaint  Patient presents with  . Back Pain  . Chest Pain    reflux  . Flank Pain  . Headache    HPI Casey Sellers is a 45 y.o. male with past medical history of hyperlipidemia and tobacco abuse presents to urgent care today with 2-day history of headache, midsternal chest pain and intermittent side pain.  Patient states daughter diagnosed with flu last week.  He reports "pressure" in midsternal region intermittent x2 days, nonradiating and relieved with belching.  He denies any recent fever or chills, cough, congestion, sore throat, dizziness, SOB, diaphoresis, palpitations or lower extremity swelling   History reviewed. No pertinent past medical history.  Patient Active Problem List   Diagnosis Date Noted  . Acute sinus infection 06/09/2017  . Allergic rhinitis 06/09/2017  . Cervical muscle pain 07/24/2016  . Elevated LDL cholesterol level 07/24/2016  . Tobacco use disorder 01/13/2016  . Routine general medical examination at a health care facility 03/05/2014    Past Surgical History:  Procedure Laterality Date  . KNEE SURGERY  1995   Cartiledge repair       Home Medications    Prior to Admission medications   Medication Sig Start Date End Date Taking? Authorizing Provider  acetaminophen (TYLENOL) 500 MG tablet Take 500 mg by mouth every 6 (six) hours as needed.    [provider]  meloxicam (MOBIC) 15 MG tablet Take 1 tablet (15 mg total) by mouth daily. 04/08/20   Olive Bass, FNP  fluticasone (FLONASE) 50 MCG/ACT nasal spray Place 2 sprays into both nostrils daily. 05/30/18 11/02/19  Myrlene Broker, MD    Family History Family History  Problem Relation Age of Onset  . Hypertension Mother   . Diabetes Mother   . Diabetes Father   . Stroke Maternal Aunt     Social History Social History   Tobacco Use  .  Smoking status: Former Smoker    Packs/day: 0.50    Years: 25.00    Pack years: 12.50    Quit date: 04/14/2017    Years since quitting: 3.3  . Smokeless tobacco: Never Used  Substance Use Topics  . Alcohol use: No    Comment: former  . Drug use: No     Allergies   Red dye   Review of Systems As stated in HPI otherwise negative   Physical Exam Triage Vital Signs ED Triage Vitals  Enc Vitals Group     BP      Pulse      Resp      Temp      Temp src      SpO2      Weight      Height      Head Circumference      Peak Flow      Pain Score      Pain Loc      Pain Edu?      Excl. in GC?    No data found.  Updated Vital Signs BP 125/86 (BP Location: Left Arm)   Pulse 96   Temp 98.9 F (37.2 C) (Oral)   Resp 18   SpO2 100%   Visual Acuity Right Eye Distance:   Left Eye Distance:   Bilateral Distance:    Right Eye Near:   Left Eye Near:  Bilateral Near:     Physical Exam Constitutional:      General: He is not in acute distress.    Appearance: He is well-developed. He is not ill-appearing, toxic-appearing or diaphoretic.  Eyes:     Pupils: Pupils are equal, round, and reactive to light.  Neck:     Vascular: No JVD.  Cardiovascular:     Rate and Rhythm: Normal rate and regular rhythm.     Heart sounds: Normal heart sounds. No murmur heard. No friction rub. No gallop.   Pulmonary:     Effort: Pulmonary effort is normal. No respiratory distress.     Breath sounds: Normal breath sounds.  Chest:     Chest wall: No tenderness.  Abdominal:     General: Bowel sounds are normal.     Palpations: Abdomen is soft. There is no hepatomegaly.     Tenderness: There is no abdominal tenderness. There is no guarding or rebound.  Genitourinary:    Comments: Negative CVA tenderness Musculoskeletal:        General: Normal range of motion.     Cervical back: Normal range of motion and neck supple.  Lymphadenopathy:     Cervical: No cervical adenopathy.  Skin:     General: Skin is warm and dry.  Neurological:     General: No focal deficit present.     Mental Status: He is alert and oriented to person, place, and time.  Psychiatric:        Mood and Affect: Mood normal.        Behavior: Behavior normal.      UC Treatments / Results  Labs (all labs ordered are listed, but only abnormal results are displayed) Labs Reviewed  POCT URINALYSIS DIPSTICK, ED / UC - Abnormal; Notable for the following components:      Result Value   Bilirubin Urine SMALL (*)    Hgb urine dipstick MODERATE (*)    Protein, ur 30 (*)    All other components within normal limits  SARS CORONAVIRUS 2 (TAT 6-24 HRS)  URINE CULTURE    EKG NSR at 96 bpm.  No evidence of ischemia  Radiology DG Abd 1 View  Result Date: 08/18/2020 CLINICAL DATA:  45 year old male with back and side pain for 3 days. Headache. Recent exposure to influenza. Dark urine. EXAM: ABDOMEN - 1 VIEW COMPARISON:  None. FINDINGS: AP view at 0936 hours. Non obstructed bowel gas pattern. Average volume of retained stool. Visible abdominal and pelvic visceral contours are normal. No urinary calculus identified. No osseous abnormality identified. IMPRESSION: Negative. Electronically Signed   By: Odessa Fleming M.D.   On: 08/18/2020 09:57    Procedures Procedures (including critical care time)  Medications Ordered in UC Medications - No data to display  Initial Impression / Assessment and Plan / UC Course  I have reviewed the triage vital signs and the nursing notes.  Pertinent labs & imaging results that were available during my care of the patient were reviewed by me and considered in my medical decision making (see chart for details).  Chest pressure -Intermittent midsternal "pressure", nonradiating with no associated symptoms atypical for ACS.  Pressure relieved with belching suggestive of GI etiology -EKG with no evidence of ischemia, NSR at 96bpm.  No old EKGs available for comparison -Counseled on  tobacco cessation.  Strict follow-up precautions with instructions to go to ER for any worsening or persistent symptoms  Flank pain -History concerning for kidney stone.  U/a shows moderate hemoglobin.  Will send for culture -Check KUB to assess for stone -Patient afebrile, nontoxic-appearing without evidence of viral or bacterial illness    Final Clinical Impressions(s) / UC Diagnoses   Final diagnoses:  Right flank pain  Chest pain, unspecified type     Discharge Instructions     Based on your symptoms and urinalysis I am concerned you may have a kidney stone that needs to pass.  Drink plenty of fluids to help stone pass.  If pain becomes worse or for any worsening chest pain, fever you will need to go to the emergency department to be evaluated.    ED Prescriptions    None     PDMP not reviewed this encounter.   Rolla Etienne, NP 08/18/20 [id number]

## 2020-08-18 NOTE — ED Triage Notes (Signed)
Pt c/o back, chest and side pain X 3 days. Pt states he has been developing headaches and states he was exposed to his daughter who has the FLU. He states he urinates dark urine and feels constipated.

## 2020-08-18 NOTE — Discharge Instructions (Addendum)
Based on your symptoms and urinalysis I am concerned you may have a kidney stone that needs to pass.  Drink plenty of fluids to help stone pass.  If pain becomes worse or for any worsening chest pain, fever you will need to go to the emergency department to be evaluated.

## 2020-08-20 LAB — URINE CULTURE: Culture: NO GROWTH

## 2022-02-16 ENCOUNTER — Ambulatory Visit (HOSPITAL_COMMUNITY)
Admission: EM | Admit: 2022-02-16 | Discharge: 2022-02-16 | Disposition: A | Discharge status: Home / Self Care | Payer: 59 | Attending: Family Medicine | Admitting: Family Medicine

## 2022-02-16 ENCOUNTER — Ambulatory Visit (INDEPENDENT_AMBULATORY_CARE_PROVIDER_SITE_OTHER): Payer: 59

## 2022-02-16 ENCOUNTER — Encounter (HOSPITAL_COMMUNITY): Payer: Self-pay | Admitting: Emergency Medicine

## 2022-02-16 DIAGNOSIS — Z1152 Encounter for screening for COVID-19: Secondary | ICD-10-CM | POA: Diagnosis not present

## 2022-02-16 DIAGNOSIS — R059 Cough, unspecified: Secondary | ICD-10-CM | POA: Diagnosis not present

## 2022-02-16 DIAGNOSIS — R509 Fever, unspecified: Secondary | ICD-10-CM | POA: Diagnosis not present

## 2022-02-16 DIAGNOSIS — J029 Acute pharyngitis, unspecified: Secondary | ICD-10-CM

## 2022-02-16 DIAGNOSIS — J069 Acute upper respiratory infection, unspecified: Secondary | ICD-10-CM

## 2022-02-16 LAB — RESP PANEL BY RT-PCR (RSV, FLU A&B, COVID)  RVPGX2
Influenza A by PCR: NEGATIVE
Influenza B by PCR: NEGATIVE
Resp Syncytial Virus by PCR: NEGATIVE
SARS Coronavirus 2 by RT PCR: NEGATIVE

## 2022-02-16 LAB — POCT RAPID STREP A, ED / UC: Streptococcus, Group A Screen (Direct): NEGATIVE

## 2022-02-16 NOTE — ED Provider Notes (Signed)
[facility]    CSN: [id number] Arrival date & time: 02/16/22  7672      History   Chief Complaint Chief Complaint  Patient presents with   Cough   Fever    HPI Casey Sellers is a 46 y.o. male.   Patient is here for not feeling well.  Having a fever of around 100, chills, headache, sore throat.  Cough with yellow sputum.  Mild runny nose, no congestion. + body aches.   This started 1 week ago, and just feels poorly.  Two at home covid tests negative.  No wheezing or sob noted.  He has been taking advil cold/sinus.       History reviewed. No pertinent past medical history.  Patient Active Problem List   Diagnosis Date Noted   Acute sinus infection 06/09/2017   Allergic rhinitis 06/09/2017   Cervical muscle pain 07/24/2016   Elevated LDL cholesterol level 07/24/2016   Tobacco use disorder 01/13/2016   Routine general medical examination at a health care facility 03/05/2014    Past Surgical History:  Procedure Laterality Date   KNEE SURGERY  1995   Cartiledge repair       Home Medications    Prior to Admission medications   Medication Sig Start Date End Date Taking? Authorizing Provider  acetaminophen (TYLENOL) 500 MG tablet Take 500 mg by mouth every 6 (six) hours as needed.    [provider]  meloxicam (MOBIC) 15 MG tablet Take 1 tablet (15 mg total) by mouth daily. 04/08/20   Olive Bass, FNP  fluticasone (FLONASE) 50 MCG/ACT nasal spray Place 2 sprays into both nostrils daily. 05/30/18 11/02/19  Myrlene Broker, MD    Family History Family History  Problem Relation Age of Onset   Hypertension Mother    Diabetes Mother    Diabetes Father    Stroke Maternal Aunt     Social History Social History   Tobacco Use   Smoking status: Former    Packs/day: 0.50    Years: 25.00    Total pack years: 12.50    Types: Cigarettes    Quit date: 04/14/2017    Years since quitting: 4.8   Smokeless tobacco: Never   Substance Use Topics   Alcohol use: No    Comment: former   Drug use: No     Allergies   Red dye   Review of Systems Review of Systems  Constitutional:  Positive for chills and fever.  HENT:  Positive for rhinorrhea and sore throat. Negative for congestion.   Respiratory:  Positive for cough. Negative for shortness of breath and wheezing.   Gastrointestinal: Negative.   Genitourinary: Negative.   Musculoskeletal: Negative.   Psychiatric/Behavioral: Negative.       Physical Exam Triage Vital Signs ED Triage Vitals  Enc Vitals Group     BP 02/16/22 0823 132/76     Pulse Rate 02/16/22 0823 98     Resp 02/16/22 0823 15     Temp 02/16/22 0823 98.6 F (37 C)     Temp Source 02/16/22 0823 Oral     SpO2 02/16/22 0823 98 %     Weight --      Height --      Head Circumference --      Peak Flow --      Pain Score 02/16/22 0822 2     Pain Loc --      Pain Edu? --      Excl.  in GC? --    No data found.  Updated Vital Signs BP 132/76 (BP Location: Left Arm)   Pulse 98   Temp 98.6 F (37 C) (Oral)   Resp 15   SpO2 98%   Visual Acuity Right Eye Distance:   Left Eye Distance:   Bilateral Distance:    Right Eye Near:   Left Eye Near:    Bilateral Near:     Physical Exam Constitutional:      Appearance: Normal appearance.  HENT:     Head: Normocephalic.     Right Ear: Tympanic membrane normal.     Left Ear: Tympanic membrane normal.     Nose: Congestion present. No rhinorrhea.     Mouth/Throat:     Mouth: Mucous membranes are moist.     Pharynx: Posterior oropharyngeal erythema present. No oropharyngeal exudate.  Cardiovascular:     Rate and Rhythm: Normal rate and regular rhythm.  Pulmonary:     Effort: Pulmonary effort is normal.     Breath sounds: Normal breath sounds.  Musculoskeletal:     Cervical back: Normal range of motion and neck supple. No tenderness.  Lymphadenopathy:     Cervical: No cervical adenopathy.  Neurological:     General: No  focal deficit present.     Mental Status: He is alert.  Psychiatric:        Mood and Affect: Mood normal.      UC Treatments / Results  Labs (all labs ordered are listed, but only abnormal results are displayed) Labs Reviewed  CULTURE, GROUP A STREP ([facility])  RESP PANEL BY RT-PCR (RSV, FLU A&B, COVID)  [id number]  POCT RAPID STREP A, ED / UC    EKG   Radiology DG Chest 2 View  Result Date: 02/16/2022 CLINICAL DATA:  cough and fever EXAM: CHEST - 2 VIEW COMPARISON:  None Available. FINDINGS: The cardiomediastinal silhouette is within normal limits. There is no focal airspace consolidation. There is no pleural effusion or pneumothorax. Biapical pleuroparenchymal scarring. There is no acute osseous abnormality. IMPRESSION: No evidence of acute cardiopulmonary disease. Electronically Signed   By: Caprice Renshaw M.D.   On: 02/16/2022 09:12    Procedures Procedures (including critical care time)  Medications Ordered in UC Medications - No data to display  Initial Impression / Assessment and Plan / UC Course  I have reviewed the triage vital signs and the nursing notes.  Pertinent labs & imaging results that were available during my care of the patient were reviewed by me and considered in my medical decision making (see chart for details).    Final Clinical Impressions(s) / UC Diagnoses   Final diagnoses:  Sore throat  Viral URI with cough     Discharge Instructions      You were seen today for upper respiratory symptoms.  Your strep test was negative and will be sent for culture.  Your chest xray was normal as well.  We have swabbed you for flu, covid and RSV today.   You are out of the window for treatment of these, but if anything is positive we will notify you.  Your symptoms do appear viral at this time.  I recommend continued use of tylenol, motrin for fever/chills.  You may use over the counter mucinex to help with cough.  If you continue to feel poorly into the middle of  next week then please return for further evaluation.     ED Prescriptions   None  PDMP not reviewed this encounter.   Jannifer Franklin, MD 02/16/22 [id number]

## 2022-02-16 NOTE — ED Triage Notes (Signed)
For over a week having cough, congestion, low grade fevers, headaches. Taking advil cold and sinus. 2 covid test were negative. Pt reports just feels bad.

## 2022-02-16 NOTE — Discharge Instructions (Addendum)
You were seen today for upper respiratory symptoms.  Your strep test was negative and will be sent for culture.  Your chest xray was normal as well.  We have swabbed you for flu, covid and RSV today.   You are out of the window for treatment of these, but if anything is positive we will notify you.  Your symptoms do appear viral at this time.  I recommend continued use of tylenol, motrin for fever/chills.  You may use over the counter mucinex to help with cough.  If you continue to feel poorly into the middle of next week then please return for further evaluation.

## 2022-02-18 LAB — CULTURE, GROUP A STREP (THRC)

## 2022-02-19 ENCOUNTER — Telehealth: Payer: 59 | Admitting: Physician Assistant

## 2022-02-19 DIAGNOSIS — J019 Acute sinusitis, unspecified: Secondary | ICD-10-CM

## 2022-02-19 DIAGNOSIS — B9689 Other specified bacterial agents as the cause of diseases classified elsewhere: Secondary | ICD-10-CM | POA: Diagnosis not present

## 2022-02-19 MED ORDER — AMOXICILLIN-POT CLAVULANATE 875-125 MG PO TABS: 1.0000 | ORAL_TABLET | Freq: Two times a day (BID) | ORAL | 0 refills | Status: DC

## 2022-02-19 MED ORDER — DOXYCYCLINE MONOHYDRATE 100 MG PO CAPS: 100.0000 mg | ORAL_CAPSULE | Freq: Two times a day (BID) | ORAL | 0 refills | Status: DC

## 2022-02-19 NOTE — Addendum Note (Signed)
Addended by: Margaretann Loveless on: 02/19/2022 06:41 PM   Modules accepted: Orders

## 2022-02-19 NOTE — Progress Notes (Signed)

## 2022-03-07 ENCOUNTER — Telehealth: Payer: 59 | Admitting: Physician Assistant

## 2022-03-07 DIAGNOSIS — U071 COVID-19: Secondary | ICD-10-CM

## 2022-03-07 MED ORDER — BENZONATATE 100 MG PO CAPS: 100.0000 mg | ORAL_CAPSULE | Freq: Three times a day (TID) | ORAL | 0 refills | Status: DC | PRN

## 2022-03-07 MED ORDER — NAPROXEN 500 MG PO TABS: 500.0000 mg | ORAL_TABLET | Freq: Two times a day (BID) | ORAL | 0 refills | Status: DC

## 2022-03-07 MED ORDER — FLUTICASONE PROPIONATE 50 MCG/ACT NA SUSP: 2.0000 | Freq: Every day | NASAL | 0 refills | Status: AC

## 2022-03-07 MED ORDER — MOLNUPIRAVIR EUA 200MG CAPSULE: 4.0000 | ORAL_CAPSULE | Freq: Two times a day (BID) | ORAL | 0 refills | Status: AC

## 2022-03-07 MED ORDER — PROMETHAZINE-DM 6.25-15 MG/5ML PO SYRP: 5.0000 mL | ORAL_SOLUTION | Freq: Four times a day (QID) | ORAL | 0 refills | Status: DC | PRN

## 2022-03-07 NOTE — Patient Instructions (Signed)
Casey Sellers, thank you for joining Piedad Climes, PA-C for today's virtual visit.  While this provider is not your primary care provider (PCP), if your PCP is located in our provider database this encounter information will be shared with them immediately following your visit.   A [facility] MyChart account gives you access to today's visit and all your visits, tests, and labs performed at [facility] " click here if you don't have a [facility] MyChart account or go to [email or web address]  Consent: (Patient) Casey Sellers provided verbal consent for this virtual visit at the beginning of the encounter.  Current Medications:  Current Outpatient Medications:    molnupiravir EUA (LAGEVRIO) 200 mg CAPS capsule, Take 4 capsules (800 mg total) by mouth 2 (two) times daily for 5 days., Disp: 40 capsule, Rfl: 0   promethazine-dextromethorphan (PROMETHAZINE-DM) 6.25-15 MG/5ML syrup, Take 5 mLs by mouth 4 (four) times daily as needed for cough., Disp: 118 mL, Rfl: 0   acetaminophen (TYLENOL) 500 MG tablet, Take 500 mg by mouth every 6 (six) hours as needed., Disp: , Rfl:    benzonatate (TESSALON) 100 MG capsule, Take 1 capsule (100 mg total) by mouth 3 (three) times daily as needed., Disp: 30 capsule, Rfl: 0   doxycycline (MONODOX) 100 MG capsule, Take 1 capsule (100 mg total) by mouth 2 (two) times daily., Disp: 20 capsule, Rfl: 0   fluticasone (FLONASE) 50 MCG/ACT nasal spray, Place 2 sprays into both nostrils daily., Disp: 16 g, Rfl: 0   naproxen (NAPROSYN) 500 MG tablet, Take 1 tablet (500 mg total) by mouth 2 (two) times daily with a meal., Disp: 30 tablet, Rfl: 0   Medications ordered in this encounter:  Meds ordered this encounter  Medications   molnupiravir EUA (LAGEVRIO) 200 mg CAPS capsule    Sig: Take 4 capsules (800 mg total) by mouth 2 (two) times daily for 5 days.    Dispense:  40 capsule    Refill:  0    Order Specific Question:   Supervising  Provider    Answer:   Merrilee Jansky [id number]   promethazine-dextromethorphan (PROMETHAZINE-DM) 6.25-15 MG/5ML syrup    Sig: Take 5 mLs by mouth 4 (four) times daily as needed for cough.    Dispense:  118 mL    Refill:  0    Order Specific Question:   Supervising Provider    Answer:   Merrilee Jansky [[id number]]     *If you need refills on other medications prior to your next appointment, please contact your pharmacy*  Follow-Up: Call back or seek an in-person evaluation if the symptoms worsen or if the condition fails to improve as anticipated.  [facility] [phone]  Other Instructions Please keep well-hydrated and get plenty of rest. Start a saline nasal rinse to flush out your nasal passages. You can use plain Mucinex to help thin congestion. If you have a humidifier, running in the bedroom at night. I want you to start OTC vitamin D3 1000 units daily, vitamin C 1000 mg daily, and a zinc supplement. Please take prescribed medications as directed.  You have been enrolled in a MyChart symptom monitoring program. Please answer these questions daily so we can keep track of how you are doing.  You were to quarantine for 5 days from onset of your symptoms.  After day 5, if you have had no fever and you are feeling better, you can end quarantine but need to mask for  an additional 5 days. After day 5 if you have a fever or are having significant symptoms, please quarantine for full 10 days.  If you note any worsening of symptoms, any significant shortness of breath or any chest pain, please seek ER evaluation ASAP.  Please do not delay care!  COVID-19: What to Do if You Are Sick If you test positive and are an older adult or someone who is at high risk of getting very sick from COVID-19, treatment may be available. Contact a healthcare provider right away after a positive test to determine if you are eligible, even if your symptoms are mild right now. You can also  visit a Test to Treat location and, if eligible, receive a prescription from a provider. Don't delay: Treatment must be started within the first few days to be effective. If you have a fever, cough, or other symptoms, you might have COVID-19. Most people have mild illness and are able to recover at home. If you are sick: Keep track of your symptoms. If you have an emergency warning sign (including trouble breathing), call 911. Steps to help prevent the spread of COVID-19 if you are sick If you are sick with COVID-19 or think you might have COVID-19, follow the steps below to care for yourself and to help protect other people in your home and community. Stay home except to get medical care Stay home. Most people with COVID-19 have mild illness and can recover at home without medical care. Do not leave your home, except to get medical care. Do not visit public areas and do not go to places where you are unable to wear a mask. Take care of yourself. Get rest and stay hydrated. Take over-the-counter medicines, such as acetaminophen, to help you feel better. Stay in touch with your doctor. Call before you get medical care. Be sure to get care if you have trouble breathing, or have any other emergency warning signs, or if you think it is an emergency. Avoid public transportation, ride-sharing, or taxis if possible. Get tested If you have symptoms of COVID-19, get tested. While waiting for test results, stay away from others, including staying apart from those living in your household. Get tested as soon as possible after your symptoms start. Treatments may be available for people with COVID-19 who are at risk for becoming very sick. Don't delay: Treatment must be started early to be effective--some treatments must begin within 5 days of your first symptoms. Contact your healthcare provider right away if your test result is positive to determine if you are eligible. Self-tests are one of several options for  testing for the virus that causes COVID-19 and may be more convenient than laboratory-based tests and point-of-care tests. Ask your healthcare provider or your local health department if you need help interpreting your test results. You can visit your state, tribal, local, and territorial health department's website to look for the latest local information on testing sites. Separate yourself from other people As much as possible, stay in a specific room and away from other people and pets in your home. If possible, you should use a separate bathroom. If you need to be around other people or animals in or outside of the home, wear a well-Sellers mask. Tell your close contacts that they may have been exposed to COVID-19. An infected person can spread COVID-19 starting 48 hours (or 2 days) before the person has any symptoms or tests positive. By letting your close contacts know they may  have been exposed to COVID-19, you are helping to protect everyone. See COVID-19 and Animals if you have questions about pets. If you are diagnosed with COVID-19, someone from the health department may call you. Answer the call to slow the spread. Monitor your symptoms Symptoms of COVID-19 include fever, cough, or other symptoms. Follow care instructions from your healthcare provider and local health department. Your local health authorities may give instructions on checking your symptoms and reporting information. When to seek emergency medical attention Look for emergency warning signs* for COVID-19. If someone is showing any of these signs, seek emergency medical care immediately: Trouble breathing Persistent pain or pressure in the chest New confusion Inability to wake or stay awake Pale, gray, or blue-colored skin, lips, or nail beds, depending on skin tone *This list is not all possible symptoms. Please call your medical provider for any other symptoms that are severe or concerning to you. Call 911 or call ahead  to your local emergency facility: Notify the operator that you are seeking care for someone who has or may have COVID-19. Call ahead before visiting your doctor Call ahead. Many medical visits for routine care are being postponed or done by phone or telemedicine. If you have a medical appointment that cannot be postponed, call your doctor's office, and tell them you have or may have COVID-19. This will help the office protect themselves and other patients. If you are sick, wear a well-Sellers mask You should wear a mask if you must be around other people or animals, including pets (even at home). Wear a mask with the best fit, protection, and comfort for you. You don't need to wear the mask if you are alone. If you can't put on a mask (because of trouble breathing, for example), cover your coughs and sneezes in some other way. Try to stay at least 6 feet away from other people. This will help protect the people around you. Masks should not be placed on young children under age 20 years, anyone who has trouble breathing, or anyone who is not able to remove the mask without help. Cover your coughs and sneezes Cover your mouth and nose with a tissue when you cough or sneeze. Throw away used tissues in a lined trash can. Immediately wash your hands with soap and water for at least 20 seconds. If soap and water are not available, clean your hands with an alcohol-based hand sanitizer that contains at least 60% alcohol. Clean your hands often Wash your hands often with soap and water for at least 20 seconds. This is especially important after blowing your nose, coughing, or sneezing; going to the bathroom; and before eating or preparing food. Use hand sanitizer if soap and water are not available. Use an alcohol-based hand sanitizer with at least 60% alcohol, covering all surfaces of your hands and rubbing them together until they feel dry. Soap and water are the best option, especially if hands are visibly  dirty. Avoid touching your eyes, nose, and mouth with unwashed hands. Handwashing Tips Avoid sharing personal household items Do not share dishes, drinking glasses, cups, eating utensils, towels, or bedding with other people in your home. Wash these items thoroughly after using them with soap and water or put in the dishwasher. Clean surfaces in your home regularly Clean and disinfect high-touch surfaces (for example, doorknobs, tables, handles, light switches, and countertops) in your "sick room" and bathroom. In shared spaces, you should clean and disinfect surfaces and items after each use by  the person who is ill. If you are sick and cannot clean, a caregiver or other person should only clean and disinfect the area around you (such as your bedroom and bathroom) on an as needed basis. Your caregiver/other person should wait as long as possible (at least several hours) and wear a mask before entering, cleaning, and disinfecting shared spaces that you use. Clean and disinfect areas that may have blood, stool, or body fluids on them. Use household cleaners and disinfectants. Clean visible dirty surfaces with household cleaners containing soap or detergent. Then, use a household disinfectant. Use a product from [employer] List N: Disinfectants for Coronavirus (COVID-19). Be sure to follow the instructions on the label to ensure safe and effective use of the product. Many products recommend keeping the surface wet with a disinfectant for a certain period of time (look at "contact time" on the product label). You may also need to wear personal protective equipment, such as gloves, depending on the directions on the product label. Immediately after disinfecting, wash your hands with soap and water for 20 seconds. For completed guidance on cleaning and disinfecting your home, visit Complete Disinfection Guidance. Take steps to improve ventilation at home Improve ventilation (air flow) at home to help prevent  from spreading COVID-19 to other people in your household. Clear out COVID-19 virus particles in the air by opening windows, using air filters, and turning on fans in your home. Use this interactive tool to learn how to improve air flow in your home. When you can be around others after being sick with COVID-19 Deciding when you can be around others is different for different situations. Find out when you can safely end home isolation. For any additional questions about your care, contact your healthcare provider or state or local health department. 06/21/2020 Content source: [facility] ([facility]), Division of Viral Diseases This information is not intended to replace advice given to you by your health care provider. Make sure you discuss any questions you have with your health care provider. Document Revised: 08/04/2020 Document Reviewed: 08/04/2020 Elsevier Patient Education  2022 [employer].      If you have been instructed to have an in-person evaluation today at a local Urgent Care facility, please use the link below. It will take you to a list of all of our available [facility], including address, phone number and hours of operation. Please do not delay care.  [facility]  If you or a family member do not have a primary care provider, use the link below to schedule a visit and establish care. When you choose a [facility] primary care physician or advanced practice provider, you gain a long-term partner in health. Find a Primary Care Provider  Learn more about [facility] in-office and virtual care options: [facility] - Get Care Now

## 2022-03-07 NOTE — Progress Notes (Signed)
Virtual Visit Consent   Casey Sellers, you are scheduled for a virtual visit with a [facility] provider today. Just as with appointments in the office, your consent must be obtained to participate. Your consent will be active for this visit and any virtual visit you may have with one of our providers in the next 365 days. If you have a MyChart account, a copy of this consent can be sent to you electronically.  As this is a virtual visit, video technology does not allow for your provider to perform a traditional examination. This may limit your provider's ability to fully assess your condition. If your provider identifies any concerns that need to be evaluated in person or the need to arrange testing (such as labs, EKG, etc.), we will make arrangements to do so. Although advances in technology are sophisticated, we cannot ensure that it will always work on either your end or our end. If the connection with a video visit is poor, the visit may have to be switched to a telephone visit. With either a video or telephone visit, we are not always able to ensure that we have a secure connection.  By engaging in this virtual visit, you consent to the provision of healthcare and authorize for your insurance to be billed (if applicable) for the services provided during this visit. Depending on your insurance coverage, you may receive a charge related to this service.  I need to obtain your verbal consent now. Are you willing to proceed with your visit today? Casey Sellers has provided verbal consent on 03/07/2022 for a virtual visit (video or telephone). Piedad Climes, [place]  Date: 03/07/2022 7:16 PM  Virtual Visit via Video Note   I, Piedad Climes, connected with  Casey Sellers  ([id number], 1976-03-10) on 03/07/22 at  7:15 PM EST by a video-enabled telemedicine application and verified that I am speaking with the correct person using two identifiers.  Location: Patient: Virtual Visit  Location Patient: Home Provider: Virtual Visit Location Provider: Home Office   I discussed the limitations of evaluation and management by telemedicine and the availability of in person appointments. The patient expressed understanding and agreed to proceed.    History of Present Illness: Casey Sellers is a 46 y.o. who identifies as a male who was assigned male at birth, and is being seen today for follow-up from [facility] regarding COVID-19 +. Was treated with medications sent in for symptom support, but was interested in antiviral treatment. As such was instructed to schedule video for further evaluation and discussion to see what treatment(s) he would be a candidate for.    HPI: HPI  Problems:  Patient Active Problem List   Diagnosis Date Noted   Acute sinus infection 06/09/2017   Allergic rhinitis 06/09/2017   Cervical muscle pain 07/24/2016   Elevated LDL cholesterol level 07/24/2016   Tobacco use disorder 01/13/2016   Routine general medical examination at a health care facility 03/05/2014    Allergies:  Allergies  Allergen Reactions   Red Dye    Medications:  Current Outpatient Medications:    molnupiravir EUA (LAGEVRIO) 200 mg CAPS capsule, Take 4 capsules (800 mg total) by mouth 2 (two) times daily for 5 days., Disp: 40 capsule, Rfl: 0   acetaminophen (TYLENOL) 500 MG tablet, Take 500 mg by mouth every 6 (six) hours as needed., Disp: , Rfl:    benzonatate (TESSALON) 100 MG capsule, Take 1 capsule (100 mg total) by mouth 3 (three)  times daily as needed., Disp: 30 capsule, Rfl: 0   doxycycline (MONODOX) 100 MG capsule, Take 1 capsule (100 mg total) by mouth 2 (two) times daily., Disp: 20 capsule, Rfl: 0   fluticasone (FLONASE) 50 MCG/ACT nasal spray, Place 2 sprays into both nostrils daily., Disp: 16 g, Rfl: 0   naproxen (NAPROSYN) 500 MG tablet, Take 1 tablet (500 mg total) by mouth 2 (two) times daily with a meal., Disp: 30 tablet, Rfl: 0  Observations/Objective: Patient is  well-developed, well-nourished in no acute distress.  Resting comfortably at home.  Head is normocephalic, atraumatic.  No labored breathing. Speech is clear and coherent with logical content.  Patient is alert and oriented at baseline.   Assessment and Plan: 1. COVID-19 - MyChart COVID-19 home monitoring program; Future - molnupiravir EUA (LAGEVRIO) 200 mg CAPS capsule; Take 4 capsules (800 mg total) by mouth 2 (two) times daily for 5 days.  Dispense: 40 capsule; Refill: 0  Patient with multiple risk factors for complicated course of illness. Discussed risks/benefits of antiviral medications including most common potential ADRs. Patient voiced understanding and would like to proceed with antiviral medication. They are candidate for Molnupiravir. Rx sent to pharmacy. Supportive measures, OTC medications and vitamin regimen reviewed. Changed Tessalon sent in earlier to Promethazine-DM due to subtherapeutic effects in the past. Patient has been enrolled in a MyChart COVID symptom monitoring program. Anne Shutter reviewed in detail. Strict ER precautions discussed with patient.    Follow Up Instructions: I discussed the assessment and treatment plan with the patient. The patient was provided an opportunity to ask questions and all were answered. The patient agreed with the plan and demonstrated an understanding of the instructions.  A copy of instructions were sent to the patient via MyChart unless otherwise noted below.   The patient was advised to call back or seek an in-person evaluation if the symptoms worsen or if the condition fails to improve as anticipated.  Time:  I spent 10 minutes with the patient via telehealth technology discussing the above problems/concerns.    Piedad Climes, PA-C

## 2022-03-07 NOTE — Progress Notes (Signed)
E-Visit  for Positive Covid Test Result  We are sorry you are not feeling well. We are here to help!  You have tested positive for COVID-19, meaning that you were infected with the novel coronavirus and could give the virus to others.  It is vitally important that you stay home so you do not spread it to others.      Please continue isolation at home, for at least 10 days since the start of your symptoms and until you have had 24 hours with no fever (without taking a fever reducer) and with improving of symptoms.  If you have no symptoms but tested positive (or all symptoms resolve after 5 days and you have no fever) you can leave your house but continue to wear a mask around others for an additional 5 days. If you have a fever,continue to stay home until you have had 24 hours of no fever. Most cases improve 5-10 days from onset but we have seen a small number of patients who have gotten worse after the 10 days.  Please be sure to watch for worsening symptoms and remain taking the proper precautions.   Go to the nearest hospital ED for assessment if fever/cough/breathlessness are severe or illness seems like a threat to life.    The following symptoms may appear 2-14 days after exposure: Fever Cough Shortness of breath or difficulty breathing Chills Repeated shaking with chills Muscle pain Headache Sore throat New loss of taste or smell Fatigue Congestion or runny nose Nausea or vomiting Diarrhea  You have been enrolled in MyChart Home Monitoring for COVID-19. Daily you will receive a questionnaire within the MyChart website. Our COVID-19 response team will be monitoring your responses daily.  You can use medication such as prescription cough medication called Tessalon Perles 100 mg. You may take 1-2 capsules every 8 hours as needed for cough, prescription anti-inflammatory called Naprosyn 500 mg. Take twice daily as needed for fever or body aches for 2 weeks, and prescription for  Fluticasone nasal spray 2 sprays in each nostril one time per day  You may also take acetaminophen (Tylenol) as needed for fever.  HOME CARE: Only take medications as instructed by your medical team. Drink plenty of fluids and get plenty of rest. A steam or ultrasonic humidifier can help if you have congestion.   GET HELP RIGHT AWAY IF YOU HAVE EMERGENCY WARNING SIGNS.  Call 911 or proceed to your closest emergency facility if: You develop worsening high fever. Trouble breathing Bluish lips or face Persistent pain or pressure in the chest New confusion Inability to wake or stay awake You cough up blood. Your symptoms become more severe Inability to hold down food or fluids  This list is not all possible symptoms. Contact your medical provider for any symptoms that are severe or concerning to you.    Your e-visit answers were reviewed by a board certified advanced clinical practitioner to complete your personal care plan.  Depending on the condition, your plan could have included both over the counter or prescription medications.  If there is a problem please reply once you have received a response from your provider.  Your safety is important to us.  If you have drug allergies check your prescription carefully.    You can use MyChart to ask questions about today's visit, request a non-urgent call back, or ask for a work or school excuse for 24 hours related to this e-Visit. If it has been greater than 24 hours   you will need to follow up with your provider, or enter a new e-Visit to address those concerns. You will get an e-mail in the next two days asking about your experience.  I hope that your e-visit has been valuable and will speed your recovery. Thank you for using e-visits.   I have spent 5 minutes in review of e-visit questionnaire, review and updating patient chart, medical decision making and response to patient.   Lundynn Cohoon M Tieara Flitton, PA-C  

## 2022-05-04 ENCOUNTER — Other Ambulatory Visit: Payer: Self-pay

## 2022-05-04 ENCOUNTER — Emergency Department (HOSPITAL_COMMUNITY)
Admission: EM | Admit: 2022-05-04 | Discharge: 2022-05-04 | Disposition: A | Discharge status: Home / Self Care | Payer: 59 | Attending: Emergency Medicine | Admitting: Emergency Medicine

## 2022-05-04 ENCOUNTER — Encounter (HOSPITAL_COMMUNITY): Payer: Self-pay

## 2022-05-04 ENCOUNTER — Emergency Department (HOSPITAL_COMMUNITY): Payer: 59

## 2022-05-04 DIAGNOSIS — R519 Headache, unspecified: Secondary | ICD-10-CM | POA: Diagnosis not present

## 2022-05-04 DIAGNOSIS — R030 Elevated blood-pressure reading, without diagnosis of hypertension: Secondary | ICD-10-CM | POA: Diagnosis not present

## 2022-05-04 DIAGNOSIS — R42 Dizziness and giddiness: Secondary | ICD-10-CM

## 2022-05-04 LAB — CBC WITH DIFFERENTIAL/PLATELET
Abs Immature Granulocytes: 0.01 10*3/uL (ref 0.00–0.07)
Basophils Absolute: 0 10*3/uL (ref 0.0–0.1)
Basophils Relative: 1 %
Eosinophils Absolute: 0.1 10*3/uL (ref 0.0–0.5)
Eosinophils Relative: 3 %
HCT: 42.7 % (ref 39.0–52.0)
Hemoglobin: 14.5 g/dL (ref 13.0–17.0)
Immature Granulocytes: 0 %
Lymphocytes Relative: 34 %
Lymphs Abs: 1.4 10*3/uL (ref 0.7–4.0)
MCH: 30.7 pg (ref 26.0–34.0)
MCHC: 34 g/dL (ref 30.0–36.0)
MCV: 90.5 fL (ref 80.0–100.0)
Monocytes Absolute: 0.4 10*3/uL (ref 0.1–1.0)
Monocytes Relative: 9 %
Neutro Abs: 2.2 10*3/uL (ref 1.7–7.7)
Neutrophils Relative %: 53 %
Platelets: 201 10*3/uL (ref 150–400)
RBC: 4.72 MIL/uL (ref 4.22–5.81)
RDW: 13.1 % (ref 11.5–15.5)
WBC: 4.1 10*3/uL (ref 4.0–10.5)
nRBC: 0 % (ref 0.0–0.2)

## 2022-05-04 LAB — BASIC METABOLIC PANEL
Anion gap: 11 (ref 5–15)
BUN: 10 mg/dL (ref 6–20)
CO2: 23 mmol/L (ref 22–32)
Calcium: 9.1 mg/dL (ref 8.9–10.3)
Chloride: 101 mmol/L (ref 98–111)
Creatinine, Ser: 0.85 mg/dL (ref 0.61–1.24)
GFR, Estimated: >60 mL/min (ref >60)
Glucose, Bld: 85 mg/dL (ref 70–99)
Potassium: 3.7 mmol/L (ref 3.5–5.1)
Sodium: 135 mmol/L (ref 135–145)

## 2022-05-04 NOTE — ED Notes (Signed)
Will verify with MD to confirm does not need a second EKG

## 2022-05-04 NOTE — ED Provider Notes (Signed)
[facility] Provider Note   CSN: [id number] Arrival date & time: 05/04/22  1038     History  Chief Complaint  Patient presents with   Dizziness    Casey Sellers is a 47 y.o. male.  Patient here with high blood pressure, episode of dizziness and headache.  He is not having any issues currently.  No major medical problems.  He denies any vision loss, chest pain, shortness of breath.  He has had some pain in his right hand at times but he does a lot of manual labor work.  Denies any weakness, numbness, chills, fever.  Has never had issues like this before.  Does not take blood pressure medications.  Denies any recent surgery or travel.  The history is provided by the patient.       Home Medications Prior to Admission medications   Medication Sig Start Date End Date Taking? Authorizing Provider  acetaminophen (TYLENOL) 500 MG tablet Take 500 mg by mouth every 6 (six) hours as needed. Patient not taking: Reported on 05/04/2022    [provider]  benzonatate (TESSALON) 100 MG capsule Take 1 capsule (100 mg total) by mouth 3 (three) times daily as needed. Patient not taking: Reported on 05/04/2022 03/07/22   Mar Daring, PA-C  fluticasone [facility]) 50 MCG/ACT nasal spray Place 2 sprays into both nostrils daily. Patient not taking: Reported on 05/04/2022 03/07/22   Mar Daring, PA-C  naproxen (NAPROSYN) 500 MG tablet Take 1 tablet (500 mg total) by mouth 2 (two) times daily with a meal. Patient not taking: Reported on 05/04/2022 03/07/22   Mar Daring, PA-C  promethazine-dextromethorphan (PROMETHAZINE-DM) 6.25-15 MG/5ML syrup Take 5 mLs by mouth 4 (four) times daily as needed for cough. Patient not taking: Reported on 05/04/2022 03/07/22   Brunetta Jeans, PA-C      Allergies    Food and Red dye    Review of Systems   Review of Systems  Physical Exam Updated Vital Signs BP (!) 145/90 (BP Location: Right  Arm)   Pulse 80   Temp 99 F (37.2 C) (Oral)   Resp 18   Ht 5\' 5"  (1.651 m)   Wt 79.4 kg   SpO2 100%   BMI 29.12 kg/m  Physical Exam Vitals and nursing note reviewed.  Constitutional:      General: He is not in acute distress.    Appearance: He is well-developed.  HENT:     Head: Normocephalic and atraumatic.     Nose: Nose normal.     Mouth/Throat:     Mouth: Mucous membranes are moist.  Eyes:     Extraocular Movements: Extraocular movements intact.     Conjunctiva/sclera: Conjunctivae normal.     Pupils: Pupils are equal, round, and reactive to light.  Cardiovascular:     Rate and Rhythm: Normal rate and regular rhythm.     Pulses: Normal pulses.     Heart sounds: Normal heart sounds. No murmur heard. Pulmonary:     Effort: Pulmonary effort is normal. No respiratory distress.     Breath sounds: Normal breath sounds.  Abdominal:     Palpations: Abdomen is soft.     Tenderness: There is no abdominal tenderness.  Musculoskeletal:        General: No swelling.     Cervical back: Neck supple.  Skin:    General: Skin is warm and dry.     Capillary Refill: Capillary refill takes  less than 2 seconds.  Neurological:     General: No focal deficit present.     Mental Status: He is alert and oriented to person, place, and time.     Cranial Nerves: No cranial nerve deficit.     Motor: No weakness.     Coordination: Coordination normal.     Comments: 5+ out of 5 strength throughout, normal sensation, no drift, normal speech  Psychiatric:        Mood and Affect: Mood normal.     ED Results / Procedures / Treatments   Labs (all labs ordered are listed, but only abnormal results are displayed) Labs Reviewed  CBC WITH DIFFERENTIAL/PLATELET  BASIC METABOLIC PANEL    EKG EKG Interpretation  Date/Time:  Friday May 04 2022 10:45:16 EST Ventricular Rate:  71 PR Interval:  134 QRS Duration: 92 QT Interval:  356 QTC Calculation: 386 R Axis:   60 Text  Interpretation: Normal sinus rhythm t wave abdominality seen on priro Confirmed by Lennice Sites ([id number]) on 05/04/2022 11:59:44 AM  Radiology CT Head Wo Contrast  Result Date: 05/04/2022 CLINICAL DATA:  Provided history: Headache, increasing frequency or severity. EXAM: CT HEAD WITHOUT CONTRAST TECHNIQUE: Contiguous axial images were obtained from the base of the skull through the vertex without intravenous contrast. RADIATION DOSE REDUCTION: This exam was performed according to the departmental dose-optimization program which includes automated exposure control, adjustment of the mA and/or kV according to patient size and/or use of iterative reconstruction technique. COMPARISON:  No pertinent prior exams available for comparison. FINDINGS: Brain: Cerebral volume is normal. There is no acute intracranial hemorrhage. No demarcated cortical infarct. No extra-axial fluid collection. No evidence of an intracranial mass. No midline shift. Vascular: No hyperdense vessel. Skull: No fracture or aggressive osseous lesion. Sinuses/Orbits: No mass or acute finding within the imaged orbits. Minimal mucosal thickening scattered within bilateral ethmoid air cells. IMPRESSION: No evidence of acute intracranial abnormality. Electronically Signed   By: Kellie Simmering D.O.   On: 05/04/2022 12:11    Procedures Procedures    Medications Ordered in ED Medications - No data to display  ED Course/ Medical Decision Making/ A&P                             Medical Decision Making Amount and/or Complexity of Data Reviewed Labs: ordered. Radiology: ordered.   Casey Sellers is here with high blood pressure, dizziness, headache.  Neurologically he is intact.  He has no significant comorbidities.  His blood pressure is 150/96.  Was elevated to 761 systolic.  He is not having any chest pain.  No stroke symptoms.  Will check CBC, BMP, CT scan and EKG to further evaluate for any issues.  Suspect that this could be stress  related versus less likely head bleed/ACS/electrolyte abnormality.  Does not seem consistent with peripheral vertigo.  Per my review and interpretation of labs is no significant anemia or electrolyte abnormality or kidney injury.  Head CT is unremarkable.  EKG shows sinus rhythm.  No ischemic changes.  Overall suspect may be some stress/dehydration.  Recommend rest and hydration at home.  Recommend that he continue to monitor his blood pressure over the course of the next few weeks and follow-up with his primary care doctor to see if they want to start any medications.  Patient discharged in good condition.  This chart was dictated using voice recognition software.  Despite best efforts to proofread,  errors  can occur which can change the documentation meaning.         Final Clinical Impression(s) / ED Diagnoses Final diagnoses:  Lightheadedness    Rx / DC Orders ED Discharge Orders     None         Lennice Sites, DO 05/04/22 1502

## 2022-05-04 NOTE — ED Triage Notes (Signed)
Pt arrived POV from work c/o dizziness x a couple days. Pt also states his BP has been elevated and his right hand has been tightening up.

## 2022-05-04 NOTE — Discharge Instructions (Signed)
Follow-up with primary care doctor.  I would keep a journal of your blood pressure for the next 2 weeks.  If you develop any stroke symptoms or severe crushing headache, severe crushing chest pain please return for evaluation.

## 2022-05-10 ENCOUNTER — Other Ambulatory Visit: Payer: Self-pay | Admitting: Emergency Medicine

## 2022-05-10 DIAGNOSIS — I10 Essential (primary) hypertension: Secondary | ICD-10-CM

## 2022-05-10 DIAGNOSIS — E785 Hyperlipidemia, unspecified: Secondary | ICD-10-CM

## 2022-05-10 HISTORY — DX: Hyperlipidemia, unspecified: E78.5

## 2022-05-10 MED ORDER — AMLODIPINE BESYLATE 5 MG PO TABS: 5.0000 mg | ORAL_TABLET | Freq: Every day | ORAL | 3 refills | Status: AC

## 2022-05-14 ENCOUNTER — Encounter: Payer: Self-pay | Admitting: Emergency Medicine

## 2022-05-14 ENCOUNTER — Ambulatory Visit: Payer: 59 | Admitting: Emergency Medicine

## 2022-05-14 VITALS — BP 134/86 | HR 85 | Temp 98.1°F | Ht 65.0 in | Wt 176.4 lb

## 2022-05-14 DIAGNOSIS — R9431 Abnormal electrocardiogram [ECG] [EKG]: Secondary | ICD-10-CM | POA: Diagnosis not present

## 2022-05-14 DIAGNOSIS — Z87891 Personal history of nicotine dependence: Secondary | ICD-10-CM | POA: Diagnosis not present

## 2022-05-14 DIAGNOSIS — Z7689 Persons encountering health services in other specified circumstances: Secondary | ICD-10-CM

## 2022-05-14 DIAGNOSIS — I10 Essential (primary) hypertension: Secondary | ICD-10-CM | POA: Diagnosis not present

## 2022-05-14 DIAGNOSIS — E785 Hyperlipidemia, unspecified: Secondary | ICD-10-CM

## 2022-05-14 HISTORY — DX: Abnormal electrocardiogram (ECG) (EKG): R94.31

## 2022-05-14 HISTORY — DX: Essential (primary) hypertension: I10

## 2022-05-14 LAB — COMPREHENSIVE METABOLIC PANEL
ALT: 17 U/L (ref 0–53)
AST: 18 U/L (ref 0–37)
Albumin: 4.5 g/dL (ref 3.5–5.2)
Alkaline Phosphatase: 56 U/L (ref 39–117)
BUN: 17 mg/dL (ref 6–23)
CO2: 26 mEq/L (ref 19–32)
Calcium: 9.9 mg/dL (ref 8.4–10.5)
Chloride: 104 mEq/L (ref 96–112)
Creatinine, Ser: 0.93 mg/dL (ref 0.40–1.50)
GFR: 97.72 mL/min (ref >60.00)
Glucose, Bld: 80 mg/dL (ref 70–99)
Potassium: 4 mEq/L (ref 3.5–5.1)
Sodium: 138 mEq/L (ref 135–145)
Total Bilirubin: 0.8 mg/dL (ref 0.2–1.2)
Total Protein: 7.5 g/dL (ref 6.0–8.3)

## 2022-05-14 LAB — LIPID PANEL
Cholesterol: 206 mg/dL — ABNORMAL HIGH (ref 0–200)
HDL: 49.2 mg/dL (ref >39.00)
LDL Cholesterol: 128 mg/dL — ABNORMAL HIGH (ref 0–99)
NonHDL: 157.22
Total CHOL/HDL Ratio: 4
Triglycerides: 144 mg/dL (ref 0.0–149.0)
VLDL: 28.8 mg/dL (ref 0.0–40.0)

## 2022-05-14 LAB — HEMOGLOBIN A1C: Hgb A1c MFr Bld: 5.4 % (ref 4.6–6.5)

## 2022-05-14 NOTE — Assessment & Plan Note (Addendum)
No formal diagnosis of hypertension.  On no medications at present time.  Most of blood pressure readings at home within normal limits. Family history of hypertension Abnormal EKG Former smoker Recommend cardiology evaluation for hypertensive heart disease Needs echocardiogram.  May need stress test. Referral placed today to cardiologist requested.

## 2022-05-14 NOTE — Assessment & Plan Note (Signed)
Lipid profile done today.  Never on medication Diet and nutrition discussed. Benefits of exercise discussed.

## 2022-05-14 NOTE — Progress Notes (Signed)
Casey Sellers 47 y.o.   No chief complaint on file.   HISTORY OF PRESENT ILLNESS: This is a 47 y.o. male here for follow-up of hypertension.  Presented emergency department on 05/04/2022 complaining of dizziness and headache.  Was found to have elevated blood pressures.  Workup was negative.  It included CBC, BMP, EKG, and CT scan of brain.  Normal results.  Has been doing well since. Family history of hypertension.  Not on medications at present time.  Blood pressure readings at home mostly normal.  Has 2 readings with systolics [id number] and [id number].  Normal diastolics. Stopped smoking 2 years ago.  Exercises regularly but not over the past 3 weeks. Denies chest pain on exertion, dyspnea on exertion, syncope, palpitations, or any other significant symptomatology. No other complaints or medical concerns today. First visit to this office, here to establish care with me.  HPI   Prior to Admission medications   Medication Sig Start Date End Date Taking? Authorizing Provider  acetaminophen (TYLENOL) 500 MG tablet Take 500 mg by mouth every 6 (six) hours as needed. Patient not taking: Reported on 05/04/2022    [provider]  amLODipine (NORVASC) 5 MG tablet Take 1 tablet (5 mg total) by mouth daily. Patient not taking: Reported on 05/14/2022 05/10/22   Horald Pollen, MD  benzonatate (TESSALON) 100 MG capsule Take 1 capsule (100 mg total) by mouth 3 (three) times daily as needed. Patient not taking: Reported on 05/04/2022 03/07/22   Mar Daring, PA-C  fluticasone [facility]) 50 MCG/ACT nasal spray Place 2 sprays into both nostrils daily. Patient not taking: Reported on 05/04/2022 03/07/22   Mar Daring, PA-C  naproxen (NAPROSYN) 500 MG tablet Take 1 tablet (500 mg total) by mouth 2 (two) times daily with a meal. Patient not taking: Reported on 05/04/2022 03/07/22   Mar Daring, PA-C  promethazine-dextromethorphan (PROMETHAZINE-DM) 6.25-15 MG/5ML syrup Take 5 mLs by  mouth 4 (four) times daily as needed for cough. Patient not taking: Reported on 05/04/2022 03/07/22   Brunetta Jeans, PA-C    Allergies  Allergen Reactions   Food     [place] Gemma raisins   Red Dye     Patient Active Problem List   Diagnosis Date Noted   Hyperlipidemia 05/10/2022   Allergic rhinitis 06/09/2017   Elevated LDL cholesterol level 07/24/2016   Tobacco use disorder 01/13/2016    No past medical history on file.  Past Surgical History:  Procedure Laterality Date   KNEE SURGERY  04/02/1993   Cartiledge repair   MOUTH SURGERY N/A     Social History   Socioeconomic History   Marital status: Divorced    Spouse name: Not on file   Number of children: 3   Years of education: 12   Highest education level: Not on file  Occupational History   Occupation: [employer]  Tobacco Use   Smoking status: Former    Packs/day: 0.50    Years: 25.00    Total pack years: 12.50    Types: Cigarettes    Quit date: 04/14/2017    Years since quitting: 5.0   Smokeless tobacco: Never  Substance and Sexual Activity   Alcohol use: Yes    Alcohol/week: 2.0 standard drinks of alcohol    Types: 1 Cans of beer, 1 Shots of liquor per week    Comment: former   Drug use: No   Sexual activity: Yes    Partners: Female  Other Topics Concern   Not  on file  Social History Narrative   Born and raised in [place], [place]. Currently resides in a house with his son, finace and step-son. Fish. Fun: hang out at the house.   Denies any religious beliefs that would effect health care.    Social Determinants of Health   Financial Resource Strain: Not on file  Food Insecurity: Not on file  Transportation Needs: Not on file  Physical Activity: Not on file  Stress: Not on file  Social Connections: Not on file  Intimate Partner Violence: Not on file    Family History  Problem Relation Age of Onset   Hypertension Mother    Diabetes Mother    High Cholesterol Mother    Arthritis Mother     Diabetes Father    High Cholesterol Father    Hypertension Father    Hypertension Sister    Hypertension Brother    Stroke Maternal Aunt      Review of Systems  Constitutional: Negative.  Negative for chills and fever.  HENT: Negative.  Negative for congestion and sore throat.   Respiratory: Negative.  Negative for cough and shortness of breath.   Cardiovascular: Negative.  Negative for chest pain and palpitations.  Gastrointestinal: Negative.  Negative for abdominal pain, diarrhea, nausea and vomiting.  Genitourinary: Negative.   Skin: Negative.  Negative for rash.  Neurological: Negative.  Negative for dizziness and headaches.  All other systems reviewed and are negative.  Today's Vitals   05/14/22 0831  BP: 134/86  Pulse: 85  Temp: 98.1 F (36.7 C)  TempSrc: Temporal  SpO2: 98%  Weight: 176 lb 6 oz (80 kg)  Height: 5' 5"$  (1.651 m)   Body mass index is 29.35 kg/m.   Physical Exam Vitals reviewed.  Constitutional:      Appearance: Normal appearance.  HENT:     Head: Normocephalic.     Mouth/Throat:     Mouth: Mucous membranes are moist.     Pharynx: Oropharynx is clear.  Eyes:     Extraocular Movements: Extraocular movements intact.     Pupils: Pupils are equal, round, and reactive to light.  Cardiovascular:     Rate and Rhythm: Normal rate and regular rhythm.     Pulses: Normal pulses.     Heart sounds: Normal heart sounds.  Pulmonary:     Effort: Pulmonary effort is normal.     Breath sounds: Normal breath sounds.  Musculoskeletal:        General: Normal range of motion.     Cervical back: No tenderness.     Right lower leg: No edema.     Left lower leg: No edema.  Lymphadenopathy:     Cervical: No cervical adenopathy.  Skin:    General: Skin is warm and dry.  Neurological:     General: No focal deficit present.     Mental Status: He is alert and oriented to person, place, and time.  Psychiatric:        Mood and Affect: Mood normal.         Behavior: Behavior normal.     EKG: Normal sinus rhythm with ventricular rate of 71/min.  Inverted T waves in 3 and aVF.  No acute ischemic changes.  Abnormal EKG.  ASSESSMENT & PLAN: A total of 47 minutes was spent with the patient and counseling/coordination of care regarding preparing for this visit, review of most recent office notes, review of most recent blood work results, review of recent emergency department visit notes,  review of most recent CT scan of the brain, review of most recent chest x-ray report, diagnosis of hypertension and cardiovascular risk associated with this condition, need to follow-up with cardiologist for further workup, need for blood work today including lipid profile, prognosis, education on nutrition, documentation and need for follow-up.   Problem List Items Addressed This Visit       Cardiovascular and Mediastinum   Essential hypertension - Primary    No formal diagnosis of hypertension.  On no medications at present time.  Most of blood pressure readings at home within normal limits. Family history of hypertension Abnormal EKG Former smoker Recommend cardiology evaluation for hypertensive heart disease Needs echocardiogram.  May need stress test. Referral placed today to cardiologist requested.      Relevant Orders   EKG 12-Lead   Comprehensive metabolic panel   Lipid panel   Hemoglobin A1c   Ambulatory referral to Cardiology     Other   Dyslipidemia    Lipid profile done today.  Never on medication Diet and nutrition discussed. Benefits of exercise discussed.      Abnormal EKG   Relevant Orders   Comprehensive metabolic panel   Lipid panel   Hemoglobin A1c   Ambulatory referral to Cardiology   Other Visit Diagnoses     Former smoker       Encounter to establish care            Patient Instructions  Hypertension, Adult High blood pressure (hypertension) is when the force of blood pumping through the arteries is too strong.  The arteries are the blood vessels that carry blood from the heart throughout the body. Hypertension forces the heart to work harder to pump blood and may cause arteries to become narrow or stiff. Untreated or uncontrolled hypertension can lead to a heart attack, heart failure, a stroke, kidney disease, and other problems. A blood pressure reading consists of a higher number over a lower number. Ideally, your blood pressure should be below 120/80. The first ("top") number is called the systolic pressure. It is a measure of the pressure in your arteries as your heart beats. The second ("bottom") number is called the diastolic pressure. It is a measure of the pressure in your arteries as the heart relaxes. What are the causes? The exact cause of this condition is not known. There are some conditions that result in high blood pressure. What increases the risk? Certain factors may make you more likely to develop high blood pressure. Some of these risk factors are under your control, including: Smoking. Not getting enough exercise or physical activity. Being overweight. Having too much fat, sugar, calories, or salt (sodium) in your diet. Drinking too much alcohol. Other risk factors include: Having a personal history of heart disease, diabetes, high cholesterol, or kidney disease. Stress. Having a family history of high blood pressure and high cholesterol. Having obstructive sleep apnea. Age. The risk increases with age. What are the signs or symptoms? High blood pressure may not cause symptoms. Very high blood pressure (hypertensive crisis) may cause: Headache. Fast or irregular heartbeats (palpitations). Shortness of breath. Nosebleed. Nausea and vomiting. Vision changes. Severe chest pain, dizziness, and seizures. How is this diagnosed? This condition is diagnosed by measuring your blood pressure while you are seated, with your arm resting on a flat surface, your legs uncrossed, and your  feet flat on the floor. The cuff of the blood pressure monitor will be placed directly against the skin of your upper arm  at the level of your heart. Blood pressure should be measured at least twice using the same arm. Certain conditions can cause a difference in blood pressure between your right and left arms. If you have a high blood pressure reading during one visit or you have normal blood pressure with other risk factors, you may be asked to: Return on a different day to have your blood pressure checked again. Monitor your blood pressure at home for 1 week or longer. If you are diagnosed with hypertension, you may have other blood or imaging tests to help your health care provider understand your overall risk for other conditions. How is this treated? This condition is treated by making healthy lifestyle changes, such as eating healthy foods, exercising more, and reducing your alcohol intake. You may be referred for counseling on a healthy diet and physical activity. Your health care provider may prescribe medicine if lifestyle changes are not enough to get your blood pressure under control and if: Your systolic blood pressure is above 130. Your diastolic blood pressure is above 80. Your personal target blood pressure may vary depending on your medical conditions, your age, and other factors. Follow these instructions at home: Eating and drinking  Eat a diet that is high in fiber and potassium, and low in sodium, added sugar, and fat. An example of this eating plan is called the DASH diet. DASH stands for Dietary Approaches to Stop Hypertension. To eat this way: Eat plenty of fresh fruits and vegetables. Try to fill one half of your plate at each meal with fruits and vegetables. Eat whole grains, such as whole-wheat pasta, brown rice, or whole-grain bread. Fill about one fourth of your plate with whole grains. Eat or drink low-fat dairy products, such as skim milk or low-fat yogurt. Avoid  fatty cuts of meat, processed or cured meats, and poultry with skin. Fill about one fourth of your plate with lean proteins, such as fish, chicken without skin, beans, eggs, or tofu. Avoid pre-made and processed foods. These tend to be higher in sodium, added sugar, and fat. Reduce your daily sodium intake. Many people with hypertension should eat less than 1,500 mg of sodium a day. Do not drink alcohol if: Your health care provider tells you not to drink. You are pregnant, may be pregnant, or are planning to become pregnant. If you drink alcohol: Limit how much you have to: 0-1 drink a day for women. 0-2 drinks a day for men. Know how much alcohol is in your drink. In the [place] one drink equals one 12 oz bottle of beer (355 mL), one 5 oz glass of wine (148 mL), or one 1 oz glass of hard liquor (44 mL). Lifestyle  Work with your health care provider to maintain a healthy body weight or to lose weight. Ask what an ideal weight is for you. Get at least 30 minutes of exercise that causes your heart to beat faster (aerobic exercise) most days of the week. Activities may include walking, swimming, or biking. Include exercise to strengthen your muscles (resistance exercise), such as Pilates or lifting weights, as part of your weekly exercise routine. Try to do these types of exercises for 30 minutes at least 3 days a week. Do not use any products that contain nicotine or tobacco. These products include cigarettes, chewing tobacco, and vaping devices, such as e-cigarettes. If you need help quitting, ask your health care provider. Monitor your blood pressure at home as told by your health care provider.  Keep all follow-up visits. This is important. Medicines Take over-the-counter and prescription medicines only as told by your health care provider. Follow directions carefully. Blood pressure medicines must be taken as prescribed. Do not skip doses of blood pressure medicine. Doing this puts you at risk  for problems and can make the medicine less effective. Ask your health care provider about side effects or reactions to medicines that you should watch for. Contact a health care provider if you: Think you are having a reaction to a medicine you are taking. Have headaches that keep coming back (recurring). Feel dizzy. Have swelling in your ankles. Have trouble with your vision. Get help right away if you: Develop a severe headache or confusion. Have unusual weakness or numbness. Feel faint. Have severe pain in your chest or abdomen. Vomit repeatedly. Have trouble breathing. These symptoms may be an emergency. Get help right away. Call 911. Do not wait to see if the symptoms will go away. Do not drive yourself to the hospital. Summary Hypertension is when the force of blood pumping through your arteries is too strong. If this condition is not controlled, it may put you at risk for serious complications. Your personal target blood pressure may vary depending on your medical conditions, your age, and other factors. For most people, a normal blood pressure is less than 120/80. Hypertension is treated with lifestyle changes, medicines, or a combination of both. Lifestyle changes include losing weight, eating a healthy, low-sodium diet, exercising more, and limiting alcohol. This information is not intended to replace advice given to you by your health care provider. Make sure you discuss any questions you have with your health care provider. Document Revised: 01/24/2021 Document Reviewed: 01/24/2021 Elsevier Patient Education  [place], MD [facility]

## 2022-05-14 NOTE — Patient Instructions (Signed)
Hypertension, Adult High blood pressure (hypertension) is when the force of blood pumping through the arteries is too strong. The arteries are the blood vessels that carry blood from the heart throughout the body. Hypertension forces the heart to work harder to pump blood and may cause arteries to become narrow or stiff. Untreated or uncontrolled hypertension can lead to a heart attack, heart failure, a stroke, kidney disease, and other problems. A blood pressure reading consists of a higher number over a lower number. Ideally, your blood pressure should be below 120/80. The first ("top") number is called the systolic pressure. It is a measure of the pressure in your arteries as your heart beats. The second ("bottom") number is called the diastolic pressure. It is a measure of the pressure in your arteries as the heart relaxes. What are the causes? The exact cause of this condition is not known. There are some conditions that result in high blood pressure. What increases the risk? Certain factors may make you more likely to develop high blood pressure. Some of these risk factors are under your control, including: Smoking. Not getting enough exercise or physical activity. Being overweight. Having too much fat, sugar, calories, or salt (sodium) in your diet. Drinking too much alcohol. Other risk factors include: Having a personal history of heart disease, diabetes, high cholesterol, or kidney disease. Stress. Having a family history of high blood pressure and high cholesterol. Having obstructive sleep apnea. Age. The risk increases with age. What are the signs or symptoms? High blood pressure may not cause symptoms. Very high blood pressure (hypertensive crisis) may cause: Headache. Fast or irregular heartbeats (palpitations). Shortness of breath. Nosebleed. Nausea and vomiting. Vision changes. Severe chest pain, dizziness, and seizures. How is this diagnosed? This condition is diagnosed by  measuring your blood pressure while you are seated, with your arm resting on a flat surface, your legs uncrossed, and your feet flat on the floor. The cuff of the blood pressure monitor will be placed directly against the skin of your upper arm at the level of your heart. Blood pressure should be measured at least twice using the same arm. Certain conditions can cause a difference in blood pressure between your right and left arms. If you have a high blood pressure reading during one visit or you have normal blood pressure with other risk factors, you may be asked to: Return on a different day to have your blood pressure checked again. Monitor your blood pressure at home for 1 week or longer. If you are diagnosed with hypertension, you may have other blood or imaging tests to help your health care provider understand your overall risk for other conditions. How is this treated? This condition is treated by making healthy lifestyle changes, such as eating healthy foods, exercising more, and reducing your alcohol intake. You may be referred for counseling on a healthy diet and physical activity. Your health care provider may prescribe medicine if lifestyle changes are not enough to get your blood pressure under control and if: Your systolic blood pressure is above 130. Your diastolic blood pressure is above 80. Your personal target blood pressure may vary depending on your medical conditions, your age, and other factors. Follow these instructions at home: Eating and drinking  Eat a diet that is high in fiber and potassium, and low in sodium, added sugar, and fat. An example of this eating plan is called the DASH diet. DASH stands for Dietary Approaches to Stop Hypertension. To eat this way: Eat   plenty of fresh fruits and vegetables. Try to fill one half of your plate at each meal with fruits and vegetables. Eat whole grains, such as whole-wheat pasta, brown rice, or whole-grain bread. Fill about one  fourth of your plate with whole grains. Eat or drink low-fat dairy products, such as skim milk or low-fat yogurt. Avoid fatty cuts of meat, processed or cured meats, and poultry with skin. Fill about one fourth of your plate with lean proteins, such as fish, chicken without skin, beans, eggs, or tofu. Avoid pre-made and processed foods. These tend to be higher in sodium, added sugar, and fat. Reduce your daily sodium intake. Many people with hypertension should eat less than 1,500 mg of sodium a day. Do not drink alcohol if: Your health care provider tells you not to drink. You are pregnant, may be pregnant, or are planning to become pregnant. If you drink alcohol: Limit how much you have to: 0-1 drink a day for women. 0-2 drinks a day for men. Know how much alcohol is in your drink. In the [place] one drink equals one 12 oz bottle of beer (355 mL), one 5 oz glass of wine (148 mL), or one 1 oz glass of hard liquor (44 mL). Lifestyle  Work with your health care provider to maintain a healthy body weight or to lose weight. Ask what an ideal weight is for you. Get at least 30 minutes of exercise that causes your heart to beat faster (aerobic exercise) most days of the week. Activities may include walking, swimming, or biking. Include exercise to strengthen your muscles (resistance exercise), such as Pilates or lifting weights, as part of your weekly exercise routine. Try to do these types of exercises for 30 minutes at least 3 days a week. Do not use any products that contain nicotine or tobacco. These products include cigarettes, chewing tobacco, and vaping devices, such as e-cigarettes. If you need help quitting, ask your health care provider. Monitor your blood pressure at home as told by your health care provider. Keep all follow-up visits. This is important. Medicines Take over-the-counter and prescription medicines only as told by your health care provider. Follow directions carefully. Blood  pressure medicines must be taken as prescribed. Do not skip doses of blood pressure medicine. Doing this puts you at risk for problems and can make the medicine less effective. Ask your health care provider about side effects or reactions to medicines that you should watch for. Contact a health care provider if you: Think you are having a reaction to a medicine you are taking. Have headaches that keep coming back (recurring). Feel dizzy. Have swelling in your ankles. Have trouble with your vision. Get help right away if you: Develop a severe headache or confusion. Have unusual weakness or numbness. Feel faint. Have severe pain in your chest or abdomen. Vomit repeatedly. Have trouble breathing. These symptoms may be an emergency. Get help right away. Call 911. Do not wait to see if the symptoms will go away. Do not drive yourself to the hospital. Summary Hypertension is when the force of blood pumping through your arteries is too strong. If this condition is not controlled, it may put you at risk for serious complications. Your personal target blood pressure may vary depending on your medical conditions, your age, and other factors. For most people, a normal blood pressure is less than 120/80. Hypertension is treated with lifestyle changes, medicines, or a combination of both. Lifestyle changes include losing weight, eating a healthy,   low-sodium diet, exercising more, and limiting alcohol. This information is not intended to replace advice given to you by your health care provider. Make sure you discuss any questions you have with your health care provider. Document Revised: 01/24/2021 Document Reviewed: 01/24/2021 Elsevier Patient Education  2023 [employer] Inc[employer]  

## 2022-05-15 ENCOUNTER — Other Ambulatory Visit: Payer: Self-pay | Admitting: Emergency Medicine

## 2022-05-15 ENCOUNTER — Encounter: Payer: Self-pay | Admitting: Emergency Medicine

## 2022-05-15 DIAGNOSIS — E785 Hyperlipidemia, unspecified: Secondary | ICD-10-CM

## 2022-05-15 MED ORDER — ROSUVASTATIN CALCIUM 10 MG PO TABS: 10.0000 mg | ORAL_TABLET | Freq: Every day | ORAL | 3 refills | Status: DC

## 2022-05-30 NOTE — Progress Notes (Signed)
CARDIOLOGY CONSULT NOTE       Patient ID: Casey Sellers MRN: [id number] DOB/AGE: [birth year] 47 y.o.  Admit date: (Not on file) Referring Physician: Sagardia Primary Physician: Horald Pollen, MD Primary Cardiologist: New Reason for Consultation: HTN/Abnormal ECG  Active Problems:   * No active hospital problems. *   HPI:  47 y.o. referred by Sagardia for HTN and abnormal ECG Seen in ED 05/04/22 with dizziness and headaches with moderately elevated BP Was not taking any meds. W/U negative with normal renal function  and negative head CT Family history of HTN. 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 Quit smoking 2 years ago Limited ETOH  ROS All other systems reviewed and negative except as noted above  Past Medical History:  Diagnosis Date   Essential hypertension 05/14/2022    Family History  Problem Relation Age of Onset   Hypertension Mother    Diabetes Mother    High Cholesterol Mother    Arthritis Mother    Diabetes Father    High Cholesterol Father    Hypertension Father    Hypertension Sister    Hypertension Brother    Stroke Maternal Aunt     Social History   Socioeconomic History   Marital status: Divorced    Spouse name: Not on file   Number of children: 3   Years of education: 12   Highest education level: Not on file  Occupational History   Occupation:  [employer]  Tobacco Use   Smoking status: Former    Packs/day: 0.50    Years: 25.00    Total pack years: 12.50    Types: Cigarettes    Quit date: 04/14/2017    Years since quitting: 5.1   Smokeless tobacco: Never  Substance and Sexual Activity   Alcohol use: Yes    Alcohol/week: 2.0 standard drinks of alcohol    Types: 1 Cans of beer, 1 Shots of liquor per week    Comment: former   Drug use: No   Sexual activity: Yes    Partners: Female  Other Topics Concern   Not on file  Social History Narrative   Born and raised in [place], [place]. Currently resides in a house with his son, finace and step-son. Fish. Fun: hang out at the house.   Denies any religious beliefs that would effect health care.    Social Determinants of Health   Financial Resource Strain: Not on file  Food Insecurity: Not on file  Transportation Needs: Not on file  Physical Activity: Not on file  Stress: Not on file  Social Connections: Not on file  Intimate Partner Violence: Not on file    Past Surgical History:  Procedure Laterality Date   KNEE SURGERY  04/02/1993   Cartiledge repair   MOUTH SURGERY N/A  Current Outpatient Medications:    acetaminophen (TYLENOL) 500 MG tablet, Take 500 mg by mouth every 6 (six) hours as needed. (Patient not taking: Reported on 05/04/2022), Disp: , Rfl:    amLODipine (NORVASC) 5 MG tablet, Take 1 tablet (5 mg total) by mouth daily. (Patient not taking: Reported on 05/14/2022), Disp: 90 tablet, Rfl: 3   benzonatate (TESSALON) 100 MG capsule, Take 1 capsule (100 mg total) by mouth 3 (three) times daily as needed. (Patient not taking: Reported on 05/04/2022), Disp: 30 capsule, Rfl: 0   fluticasone (FLONASE) 50 MCG/ACT nasal spray, Place 2 sprays into both nostrils daily. (Patient not taking: Reported on 05/04/2022), Disp: 16 g, Rfl: 0   naproxen (NAPROSYN) 500 MG tablet, Take 1 tablet (500 mg total) by mouth 2 (two) times daily with a meal. (Patient not taking: Reported on  05/04/2022), Disp: 30 tablet, Rfl: 0   promethazine-dextromethorphan (PROMETHAZINE-DM) 6.25-15 MG/5ML syrup, Take 5 mLs by mouth 4 (four) times daily as needed for cough. (Patient not taking: Reported on 05/04/2022), Disp: 118 mL, Rfl: 0   rosuvastatin (CRESTOR) 10 MG tablet, Take 1 tablet (10 mg total) by mouth daily., Disp: 90 tablet, Rfl: 3    Physical Exam: There were no vitals taken for this visit.    Affect appropriate Healthy:  appears stated age 47: normal Neck supple with no adenopathy JVP normal no bruits no thyromegaly Lungs clear with no wheezing and good diaphragmatic motion Heart:  S1/S2 no murmur, no rub, gallop or click PMI normal Abdomen: benighn, BS positve, no tenderness, no AAA no bruit.  No HSM or HJR Distal pulses intact with no bruits No edema Neuro non-focal Skin warm and dry No muscular weakness   Labs:   Lab Results  Component Value Date   WBC 4.1 05/04/2022   HGB 14.5 05/04/2022   HCT 42.7 05/04/2022   MCV 90.5 05/04/2022   PLT 201 05/04/2022   No results for input(s): "NA", "K", "CL", "CO2", "BUN", "CREATININE", "CALCIUM", "PROT", "BILITOT", "ALKPHOS", "ALT", "AST", "GLUCOSE" in the last 168 hours.  Invalid input(s): "LABALBU" No results found for: "CKTOTAL", "CKMB", "CKMBINDEX", "TROPONINI"  Lab Results  Component Value Date   CHOL 206 (H) 05/14/2022   CHOL 168 05/22/2017   CHOL 215 (H) 01/13/2016   Lab Results  Component Value Date   HDL 49.20 05/14/2022   HDL 34.90 (L) 05/22/2017   HDL 51.20 01/13/2016   Lab Results  Component Value Date   LDLCALC 128 (H) 05/14/2022   LDLCALC 110 (H) 05/22/2017   LDLCALC 136 (H) 01/13/2016   Lab Results  Component Value Date   TRIG 144.0 05/14/2022   TRIG 115.0 05/22/2017   TRIG 139.0 01/13/2016   Lab Results  Component Value Date   CHOLHDL 4 05/14/2022   CHOLHDL 5 05/22/2017   CHOLHDL 4 01/13/2016   Lab Results  Component Value Date   LDLDIRECT 85.3 03/08/2014      Radiology: CT  Head Wo Contrast  Result Date: 05/04/2022 CLINICAL DATA:  Provided history: Headache, increasing frequency or severity. EXAM: CT HEAD WITHOUT CONTRAST TECHNIQUE: Contiguous axial images were obtained from the base of the skull through the vertex without intravenous contrast. RADIATION DOSE REDUCTION: This exam was performed according to the departmental dose-optimization program which includes automated exposure control, adjustment of the mA and/or kV according to patient size and/or use of iterative reconstruction technique. COMPARISON:  No pertinent prior exams available for comparison. FINDINGS: Brain: Cerebral volume is normal. There is no acute intracranial hemorrhage.  No demarcated cortical infarct. No extra-axial fluid collection. No evidence of an intracranial mass. No midline shift. Vascular: No hyperdense vessel. Skull: No fracture or aggressive osseous lesion. Sinuses/Orbits: No mass or acute finding within the imaged orbits. Minimal mucosal thickening scattered within bilateral ethmoid air cells. IMPRESSION: No evidence of acute intracranial abnormality. Electronically Signed   By: Kellie Simmering D.O.   On: 05/04/2022 12:11    EKG: See HPI   ASSESSMENT AND PLAN:   HTN:  home readings ok per primary not starting norvasc  Abnormal ECG:  normal variant for patient chronic changes TTE to r/o LVH/HOCM or other structural abnormality HLD:  continue statin Coronary calcium score to risk stratify for CAD risk   Calcium Score TTE  F/U cardiology PRN  Signed: Jenkins Rouge 05/30/2022, 7:50 AM

## 2022-06-13 ENCOUNTER — Encounter: Payer: Self-pay | Admitting: Cardiovascular Disease

## 2022-06-13 ENCOUNTER — Ambulatory Visit: Payer: 59 | Attending: Cardiovascular Disease | Admitting: Cardiovascular Disease

## 2022-06-13 VITALS — BP 132/82 | HR 76 | Ht 65.0 in | Wt 176.2 lb

## 2022-06-13 DIAGNOSIS — E7800 Pure hypercholesterolemia, unspecified: Secondary | ICD-10-CM

## 2022-06-13 DIAGNOSIS — R9431 Abnormal electrocardiogram [ECG] [EKG]: Secondary | ICD-10-CM

## 2022-06-13 DIAGNOSIS — I10 Essential (primary) hypertension: Secondary | ICD-10-CM

## 2022-06-13 NOTE — Patient Instructions (Signed)
Medication Instructions:  Your physician recommends that you continue on your current medications as directed. Please refer to the Current Medication list given to you today.  *If you need a refill on your cardiac medications before your next appointment, please call your pharmacy*  Lab Work: If you have labs (blood work) drawn today and your tests are completely normal, you will receive your results only by: [place] (if you have MyChart) OR A paper copy in the mail If you have any lab test that is abnormal or we need to change your treatment, we will call you to review the results.  Testing/Procedures: Your physician has requested that you have an echocardiogram. Echocardiography is a painless test that uses sound waves to create images of your heart. It provides your doctor with information about the size and shape of your heart and how well your heart's chambers and valves are working. This procedure takes approximately one hour. There are no restrictions for this procedure. Please do NOT wear cologne, perfume, aftershave, or lotions (deodorant is allowed). Please arrive 15 minutes prior to your appointment time.  Cardiac CT scanning for calcium score, (CAT scanning), is a noninvasive, special x-ray that produces cross-sectional images of the body using x-rays and a computer. CT scans help physicians diagnose and treat medical conditions. For some CT exams, a contrast material is used to enhance visibility in the area of the body being studied. CT scans provide greater clarity and reveal more details than regular x-ray exams.  Follow-Up: At [facility], you and your health needs are our priority.  As part of our continuing mission to provide you with exceptional heart care, we have created designated Provider Care Teams.  These Care Teams include your primary Cardiologist (physician) and Advanced Practice Providers (APPs -  Physician Assistants and Nurse Practitioners) who all  work together to provide you with the care you need, when you need it.  We recommend signing up for the patient portal called "MyChart".  Sign up information is provided on this After Visit Summary.  MyChart is used to connect with patients for Virtual Visits (Telemedicine).  Patients are able to view lab/test results, encounter notes, upcoming appointments, etc.  Non-urgent messages can be sent to your provider as well.   To learn more about what you can do with MyChart, go to [email or web address].    Your next appointment:   As needed  Provider:   Jenkins Rouge, MD

## 2022-07-12 ENCOUNTER — Ambulatory Visit (HOSPITAL_COMMUNITY)
Admission: RE | Admit: 2022-07-12 | Discharge: 2022-07-12 | Disposition: A | Discharge status: Home / Self Care | Payer: 59 | Source: Ambulatory Visit | Attending: Cardiovascular Disease | Admitting: Cardiovascular Disease

## 2022-07-12 ENCOUNTER — Ambulatory Visit (HOSPITAL_BASED_OUTPATIENT_CLINIC_OR_DEPARTMENT_OTHER): Payer: 59

## 2022-07-12 DIAGNOSIS — R9431 Abnormal electrocardiogram [ECG] [EKG]: Secondary | ICD-10-CM | POA: Diagnosis present

## 2022-07-12 DIAGNOSIS — E7800 Pure hypercholesterolemia, unspecified: Secondary | ICD-10-CM | POA: Diagnosis present

## 2022-07-12 DIAGNOSIS — I10 Essential (primary) hypertension: Secondary | ICD-10-CM

## 2022-07-12 LAB — ECHOCARDIOGRAM COMPLETE
Area-P 1/2: 4.83 cm2
S' Lateral: 2.85 cm

## 2022-07-31 ENCOUNTER — Ambulatory Visit: Payer: 59 | Admitting: Emergency Medicine

## 2022-07-31 ENCOUNTER — Ambulatory Visit: Payer: 59 | Admitting: Internal Medicine

## 2022-07-31 ENCOUNTER — Encounter: Payer: Self-pay | Admitting: Emergency Medicine

## 2022-07-31 VITALS — BP 126/78 | HR 85 | Temp 98.8°F | Ht 65.0 in | Wt 175.0 lb

## 2022-07-31 DIAGNOSIS — J0100 Acute maxillary sinusitis, unspecified: Secondary | ICD-10-CM

## 2022-07-31 DIAGNOSIS — B9789 Other viral agents as the cause of diseases classified elsewhere: Secondary | ICD-10-CM | POA: Insufficient documentation

## 2022-07-31 DIAGNOSIS — R0981 Nasal congestion: Secondary | ICD-10-CM | POA: Diagnosis not present

## 2022-07-31 DIAGNOSIS — J988 Other specified respiratory disorders: Secondary | ICD-10-CM

## 2022-07-31 DIAGNOSIS — B349 Viral infection, unspecified: Secondary | ICD-10-CM | POA: Insufficient documentation

## 2022-07-31 DIAGNOSIS — R051 Acute cough: Secondary | ICD-10-CM | POA: Diagnosis not present

## 2022-07-31 MED ORDER — BENZONATATE 200 MG PO CAPS
200.0000 mg | ORAL_CAPSULE | Freq: Two times a day (BID) | ORAL | 0 refills | Status: DC | PRN
Start: 2022-07-31 — End: 2024-01-01

## 2022-07-31 MED ORDER — HYDROCODONE BIT-HOMATROP MBR 5-1.5 MG/5ML PO SOLN: 5.0000 mL | Freq: Every evening | ORAL | 0 refills | Status: DC | PRN

## 2022-07-31 MED ORDER — AMOXICILLIN-POT CLAVULANATE 875-125 MG PO TABS
1.0000 | ORAL_TABLET | Freq: Two times a day (BID) | ORAL | 0 refills | Status: AC
Start: 2022-07-31 — End: 2022-08-07

## 2022-07-31 NOTE — Progress Notes (Signed)
Casey Sellers 47 y.o.   Chief Complaint  Patient presents with   Cough    Congestion and low fever started last Friday has gotten worse , excessive mucus and having headache, can't sleep.    HISTORY OF PRESENT ILLNESS: This is a 47 y.o. male complaining of flulike symptoms that started about 11 days ago.  Felt better 2 days ago but symptoms came back again yesterday.  Mostly complaining of productive cough purulent nasal discharge.  Had low-grade fever.  No sore throat.  Intermittent headaches, unable to sleep well. No other complaints or medical concerns today.  Cough Associated symptoms include a fever and headaches. Pertinent negatives include no chest pain, rash, sore throat or shortness of breath.     Prior to Admission medications   Medication Sig Start Date End Date Taking? Authorizing Provider  rosuvastatin (CRESTOR) 10 MG tablet Take 1 tablet (10 mg total) by mouth daily. 05/15/22  Yes Lorelei Heikkila, Eilleen Kempf, MD  acetaminophen (TYLENOL) 500 MG tablet Take 500 mg by mouth every 6 (six) hours as needed. Patient not taking: Reported on 05/04/2022    [provider]  amLODipine (NORVASC) 5 MG tablet Take 1 tablet (5 mg total) by mouth daily. Patient not taking: Reported on 05/14/2022 05/10/22   Georgina Quint, MD  benzonatate (TESSALON) 100 MG capsule Take 1 capsule (100 mg total) by mouth 3 (three) times daily as needed. Patient not taking: Reported on 05/04/2022 03/07/22   Margaretann Loveless, PA-C  fluticasone [facility]) 50 MCG/ACT nasal spray Place 2 sprays into both nostrils daily. Patient not taking: Reported on 05/04/2022 03/07/22   Margaretann Loveless, PA-C  naproxen (NAPROSYN) 500 MG tablet Take 1 tablet (500 mg total) by mouth 2 (two) times daily with a meal. Patient not taking: Reported on 05/04/2022 03/07/22   Margaretann Loveless, PA-C  promethazine-dextromethorphan (PROMETHAZINE-DM) 6.25-15 MG/5ML syrup Take 5 mLs by mouth 4 (four) times daily as needed for  cough. Patient not taking: Reported on 05/04/2022 03/07/22   Waldon Merl, PA-C    Allergies  Allergen Reactions   Food     Renette Butters raisins   Red Dye     Patient Active Problem List   Diagnosis Date Noted   Essential hypertension 05/14/2022   Abnormal EKG 05/14/2022   Dyslipidemia 05/10/2022   Allergic rhinitis 06/09/2017   Elevated LDL cholesterol level 07/24/2016   Tobacco use disorder 01/13/2016    Past Medical History:  Diagnosis Date   Abnormal EKG 05/14/2022   Allergic rhinitis 06/09/2017   Dyslipidemia 05/10/2022   Elevated LDL cholesterol level 07/24/2016   Essential hypertension 05/14/2022    Past Surgical History:  Procedure Laterality Date   KNEE SURGERY  04/02/1993   Cartiledge repair   MOUTH SURGERY N/A     Social History   Socioeconomic History   Marital status: Married    Spouse name: Not on file   Number of children: 3   Years of education: 12   Highest education level: Not on file  Occupational History   Occupation: [employer]  Tobacco Use   Smoking status: Former    Packs/day: 0.50    Years: 25.00    Additional pack years: 0.00    Total pack years: 12.50    Types: Cigarettes    Quit date: 04/14/2017    Years since quitting: 5.2   Smokeless tobacco: Never  Substance and Sexual Activity   Alcohol use: Yes    Alcohol/week: 2.0 standard drinks of alcohol  Types: 1 Cans of beer, 1 Shots of liquor per week    Comment: former   Drug use: No   Sexual activity: Yes    Partners: Female  Other Topics Concern   Not on file  Social History Narrative   Born and raised in [place], [place]. Currently resides in a house with his son, finace and step-son. Fish. Fun: hang out at the house.   Denies any religious beliefs that would effect health care.    Social Determinants of Health   Financial Resource Strain: Not on file  Food Insecurity: Not on file  Transportation Needs: Not on file  Physical Activity: Not on file  Stress: Not on  file  Social Connections: Not on file  Intimate Partner Violence: Not on file    Family History  Problem Relation Age of Onset   Hypertension Mother    Diabetes Mother    High Cholesterol Mother    Arthritis Mother    Diabetes Father    High Cholesterol Father    Hypertension Father    Hypertension Sister    Hypertension Brother    Stroke Maternal Aunt      Review of Systems  Constitutional:  Positive for fever.  HENT:  Positive for congestion. Negative for sore throat.   Respiratory:  Positive for cough and sputum production. Negative for shortness of breath.   Cardiovascular: Negative.  Negative for chest pain and palpitations.  Gastrointestinal:  Negative for abdominal pain, nausea and vomiting.  Genitourinary: Negative.   Skin:  Negative for rash.  Neurological:  Positive for headaches. Negative for dizziness.  Psychiatric/Behavioral:  The patient has insomnia.   All other systems reviewed and are negative.   Vitals:   07/31/22 1314  BP: 126/78  Pulse: 85  Temp: 98.8 F (37.1 C)  SpO2: 98%    Physical Exam Vitals reviewed.  Constitutional:      Appearance: Normal appearance.  HENT:     Head: Normocephalic.     Right Ear: Tympanic membrane, ear canal and external ear normal.     Left Ear: Tympanic membrane, ear canal and external ear normal.     Nose: Congestion present.     Right Sinus: Maxillary sinus tenderness present.     Left Sinus: Maxillary sinus tenderness present.     Mouth/Throat:     Mouth: Mucous membranes are moist.     Pharynx: Oropharynx is clear.  Eyes:     Extraocular Movements: Extraocular movements intact.     Conjunctiva/sclera: Conjunctivae normal.     Pupils: Pupils are equal, round, and reactive to light.  Cardiovascular:     Rate and Rhythm: Normal rate and regular rhythm.     Pulses: Normal pulses.     Heart sounds: Normal heart sounds.  Pulmonary:     Effort: Pulmonary effort is normal.     Breath sounds: Normal breath  sounds.  Musculoskeletal:     Cervical back: No tenderness.  Lymphadenopathy:     Cervical: No cervical adenopathy.  Skin:    General: Skin is warm and dry.     Capillary Refill: Capillary refill takes less than 2 seconds.  Neurological:     Mental Status: He is alert and oriented to person, place, and time.  Psychiatric:        Mood and Affect: Mood normal.        Behavior: Behavior normal.      ASSESSMENT & PLAN: Problem List Items Addressed This Visit  Respiratory   Acute non-recurrent maxillary sinusitis - Primary    Secondary to viral infection Recommend Augmentin 875 twice a day for 7 days.       Relevant Medications   amoxicillin-clavulanate (AUGMENTIN) 875-125 MG tablet   benzonatate (TESSALON) 200 MG capsule   HYDROcodone bit-homatropine (HYCODAN) 5-1.5 MG/5ML syrup   Viral respiratory infection    Clinically stable and running its course      Sinus congestion    Recommend over-the-counter nasal saline spray        Other   Acute cough    Cough management discussed Continue over-the-counter Mucinex DM or DayQuil Start Tessalon 200 mg 3 times a day and Hycodan syrup at bedtime Advised to stay well-hydrated and rest      Relevant Medications   benzonatate (TESSALON) 200 MG capsule   HYDROcodone bit-homatropine (HYCODAN) 5-1.5 MG/5ML syrup   Patient Instructions  Cough, Adult A cough helps to clear your throat and lungs. It may be a sign of an illness or another condition. A short-term (acute) cough may last 2-3 weeks. A long-term (chronic) cough may last 8 or more weeks. Many things can cause a cough. They include: Illnesses such as: An infection in your throat or lungs. Asthma or other heart or lung problems. Gastroesophageal reflux. This is when acid comes back up from your stomach. Breathing in things that bother (irritate) your lungs. Allergies. Postnasal drip. This is when mucus runs down the back of your throat. Smoking. Some  medicines. Follow these instructions at home: Medicines Take over-the-counter and prescription medicines only as told by your doctor. Talk with your doctor before you take cough medicine (cough suppressants). Eating and drinking Do not drink alcohol. Do not drink caffeine. Drink enough fluid to keep your pee (urine) pale yellow. Lifestyle Stay away from cigarette smoke. Do not smoke or use any products that contain nicotine or tobacco. If you need help quitting, ask your doctor. Stay away from things that make you cough. These may include perfume, candles, cleaning products, or campfire smoke. General instructions  Watch for any changes to your cough. Tell your doctor about them. Always cover your mouth when you cough. If the air is dry in your home, use a cool mist vaporizer or humidifier. If your cough is worse at night, try using extra pillows to raise your head up higher while you sleep. Rest as needed. Contact a doctor if: You have new symptoms. Your symptoms get worse. You cough up pus. You have a fever that does not go away. Your cough does not get better after 2-3 weeks. Cough medicine does not help, and you are not sleeping well. You have pain that gets worse or is not helped with medicine. You are losing weight and do not know why. You have night sweats. Get help right away if: You cough up blood. You have trouble breathing. Your heart is beating very fast. These symptoms may be an emergency. Get help right away. Call 911. Do not wait to see if the symptoms will go away. Do not drive yourself to the hospital. This information is not intended to replace advice given to you by your health care provider. Make sure you discuss any questions you have with your health care provider. Document Revised: 11/17/2021 Document Reviewed: 11/17/2021 Elsevier Patient Education  2023 Elsevier Inc.     Edwina Barth, MD [facility]

## 2022-07-31 NOTE — Assessment & Plan Note (Signed)
Recommend over-the-counter nasal saline spray

## 2022-07-31 NOTE — Assessment & Plan Note (Signed)
Cough management discussed Continue over-the-counter Mucinex DM or DayQuil Start Tessalon 200 mg 3 times a day and Hycodan syrup at bedtime Advised to stay well-hydrated and rest

## 2022-07-31 NOTE — Assessment & Plan Note (Addendum)
Secondary to viral infection Recommend Augmentin 875 twice a day for 7 days.

## 2022-07-31 NOTE — Patient Instructions (Signed)
Cough, Adult A cough helps to clear your throat and lungs. It may be a sign of an illness or another condition. A short-term (acute) cough may last 2-3 weeks. A long-term (chronic) cough may last 8 or more weeks. Many things can cause a cough. They include: Illnesses such as: An infection in your throat or lungs. Asthma or other heart or lung problems. Gastroesophageal reflux. This is when acid comes back up from your stomach. Breathing in things that bother (irritate) your lungs. Allergies. Postnasal drip. This is when mucus runs down the back of your throat. Smoking. Some medicines. Follow these instructions at home: Medicines Take over-the-counter and prescription medicines only as told by your doctor. Talk with your doctor before you take cough medicine (cough suppressants). Eating and drinking Do not drink alcohol. Do not drink caffeine. Drink enough fluid to keep your pee (urine) pale yellow. Lifestyle Stay away from cigarette smoke. Do not smoke or use any products that contain nicotine or tobacco. If you need help quitting, ask your doctor. Stay away from things that make you cough. These may include perfume, candles, cleaning products, or campfire smoke. General instructions  Watch for any changes to your cough. Tell your doctor about them. Always cover your mouth when you cough. If the air is dry in your home, use a cool mist vaporizer or humidifier. If your cough is worse at night, try using extra pillows to raise your head up higher while you sleep. Rest as needed. Contact a doctor if: You have new symptoms. Your symptoms get worse. You cough up pus. You have a fever that does not go away. Your cough does not get better after 2-3 weeks. Cough medicine does not help, and you are not sleeping well. You have pain that gets worse or is not helped with medicine. You are losing weight and do not know why. You have night sweats. Get help right away if: You cough up  blood. You have trouble breathing. Your heart is beating very fast. These symptoms may be an emergency. Get help right away. Call 911. Do not wait to see if the symptoms will go away. Do not drive yourself to the hospital. This information is not intended to replace advice given to you by your health care provider. Make sure you discuss any questions you have with your health care provider. Document Revised: 11/17/2021 Document Reviewed: 11/17/2021 Elsevier Patient Education  2023 Elsevier Inc.  

## 2022-07-31 NOTE — Assessment & Plan Note (Signed)
Clinically stable and running its course

## 2023-01-08 IMAGING — DX DG ABDOMEN 1V
1 series · 1 of 1 positions shown · non-contrast
Comparison: None.

CLINICAL DATA: 44-year-old male with back and side pain for 3 days.
Headache. Recent exposure to influenza. Dark urine.

EXAM:
ABDOMEN - 1 VIEW

[abdomen kub]
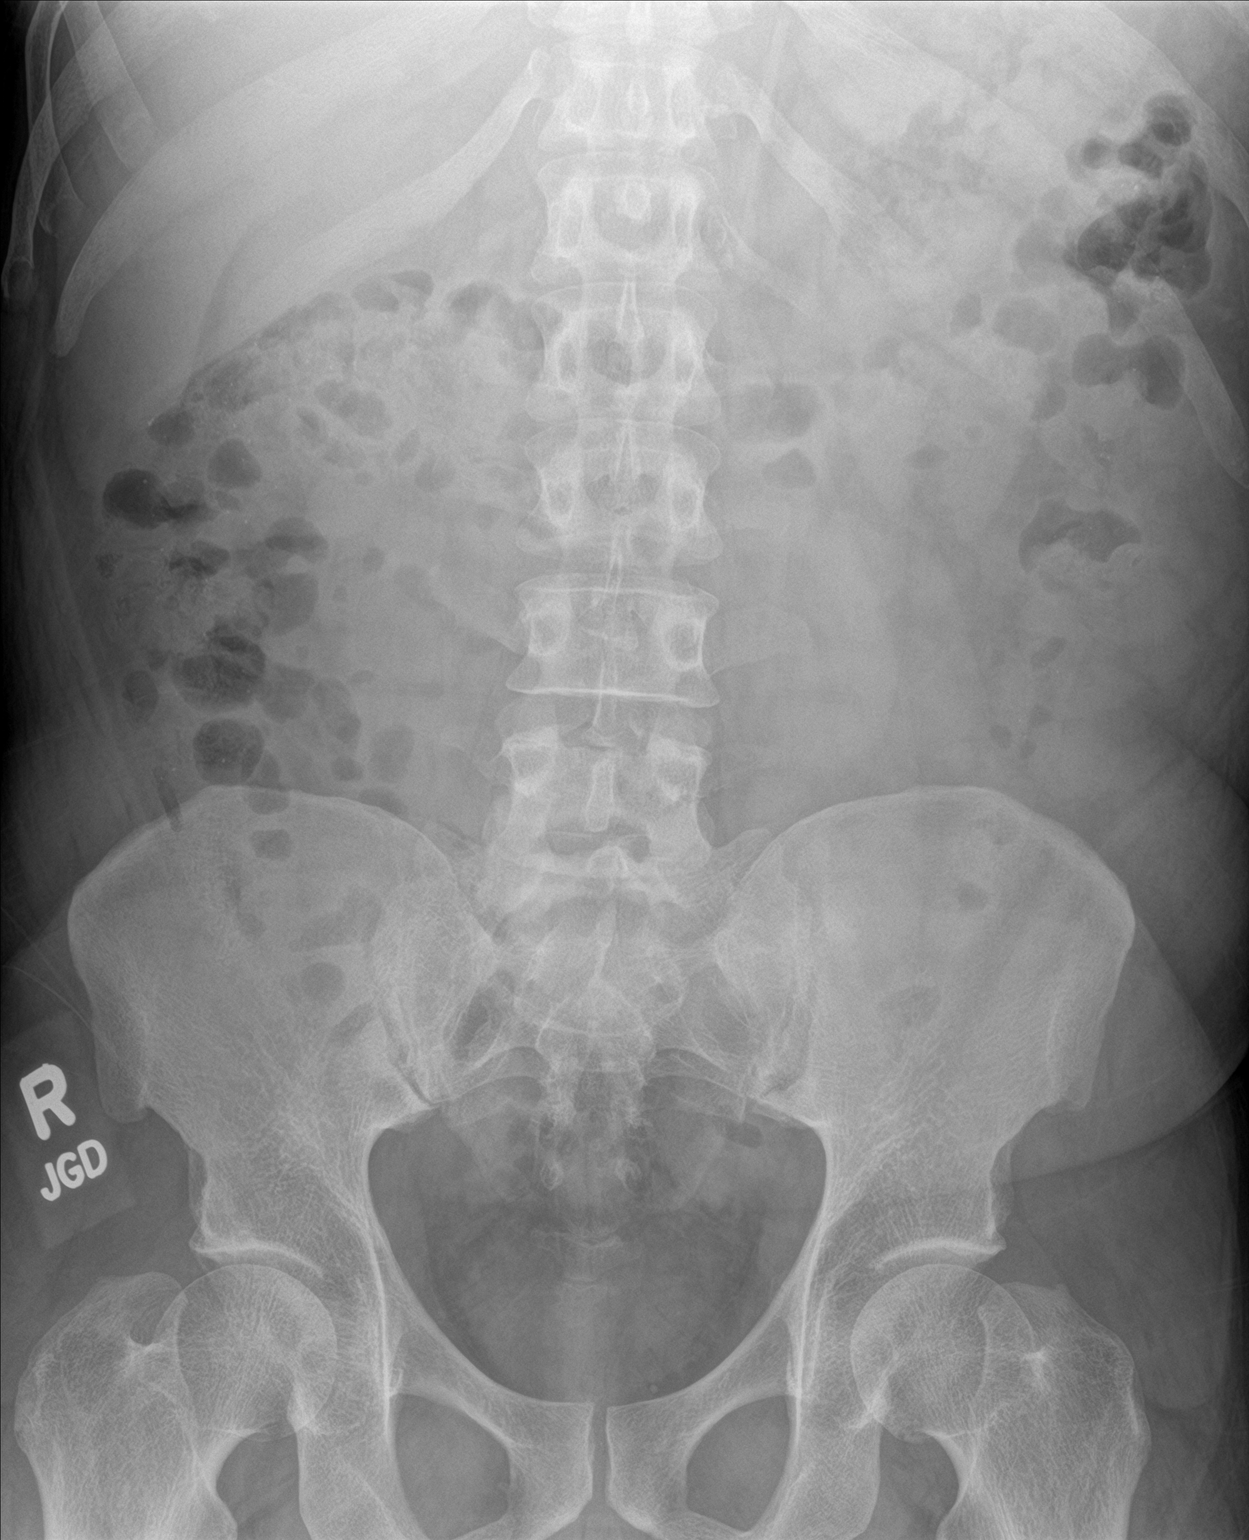

[1 of 1 positions shown; findings below may reference images not displayed]

FINDINGS: AP view at 7562 hours. Non obstructed bowel gas pattern. Average
volume of retained stool. Visible abdominal and pelvic visceral
contours are normal. No urinary calculus identified. No osseous
abnormality identified.
IMPRESSION: Negative.

## 2023-05-19 ENCOUNTER — Encounter: Payer: Self-pay | Admitting: Emergency Medicine

## 2023-05-20 ENCOUNTER — Other Ambulatory Visit: Payer: Self-pay | Admitting: Radiology

## 2023-05-20 DIAGNOSIS — Z0000 Encounter for general adult medical examination without abnormal findings: Secondary | ICD-10-CM

## 2023-05-20 NOTE — Telephone Encounter (Signed)
Okay to order CBC, CMP, lipid profile, hemoglobin A1c prior to appointment

## 2023-05-21 ENCOUNTER — Encounter: Payer: Self-pay | Admitting: Emergency Medicine

## 2023-05-21 ENCOUNTER — Ambulatory Visit: Payer: 59 | Admitting: Emergency Medicine

## 2023-05-21 ENCOUNTER — Encounter: Payer: Self-pay | Admitting: Radiology

## 2023-05-21 VITALS — BP 118/72 | HR 101 | Temp 98.4°F | Ht 65.0 in | Wt 173.0 lb

## 2023-05-21 DIAGNOSIS — J22 Unspecified acute lower respiratory infection: Secondary | ICD-10-CM | POA: Insufficient documentation

## 2023-05-21 DIAGNOSIS — R6889 Other general symptoms and signs: Secondary | ICD-10-CM

## 2023-05-21 LAB — CBC WITH DIFFERENTIAL/PLATELET
Basophils Absolute: 0 10*3/uL (ref 0.0–0.1)
Basophils Relative: 0.5 % (ref 0.0–3.0)
Eosinophils Absolute: 0.2 10*3/uL (ref 0.0–0.7)
Eosinophils Relative: 2.2 % (ref 0.0–5.0)
HCT: 44.1 % (ref 39.0–52.0)
Hemoglobin: 15 g/dL (ref 13.0–17.0)
Lymphocytes Relative: 20.7 % (ref 12.0–46.0)
Lymphs Abs: 1.5 10*3/uL (ref 0.7–4.0)
MCHC: 34.1 g/dL (ref 30.0–36.0)
MCV: 91.9 fL (ref 78.0–100.0)
Monocytes Absolute: 0.7 10*3/uL (ref 0.1–1.0)
Monocytes Relative: 9.1 % (ref 3.0–12.0)
Neutro Abs: 5 10*3/uL (ref 1.4–7.7)
Neutrophils Relative %: 67.5 % (ref 43.0–77.0)
Platelets: 226 10*3/uL (ref 150.0–400.0)
RBC: 4.8 Mil/uL (ref 4.22–5.81)
RDW: 12.8 % (ref 11.5–15.5)
WBC: 7.3 10*3/uL (ref 4.0–10.5)

## 2023-05-21 LAB — COMPREHENSIVE METABOLIC PANEL
ALT: 11 U/L (ref 0–53)
AST: 13 U/L (ref 0–37)
Albumin: 4.5 g/dL (ref 3.5–5.2)
Alkaline Phosphatase: 62 U/L (ref 39–117)
BUN: 12 mg/dL (ref 6–23)
CO2: 28 meq/L (ref 19–32)
Calcium: 9.8 mg/dL (ref 8.4–10.5)
Chloride: 102 meq/L (ref 96–112)
Creatinine, Ser: 0.92 mg/dL (ref 0.40–1.50)
GFR: 98.29 mL/min (ref >60.00)
Glucose, Bld: 116 mg/dL — ABNORMAL HIGH (ref 70–99)
Potassium: 4.4 meq/L (ref 3.5–5.1)
Sodium: 138 meq/L (ref 135–145)
Total Bilirubin: 0.9 mg/dL (ref 0.2–1.2)
Total Protein: 8 g/dL (ref 6.0–8.3)

## 2023-05-21 LAB — HEMOGLOBIN A1C: Hgb A1c MFr Bld: 5.9 % (ref 4.6–6.5)

## 2023-05-21 MED ORDER — AZITHROMYCIN 250 MG PO TABS: ORAL_TABLET | ORAL | 0 refills | Status: DC

## 2023-05-21 NOTE — Assessment & Plan Note (Signed)
Symptom management discussed Advised to rest and stay well-hydrated May take over-the-counter Mucinex DM as needed Tylenol and or Advil as needed

## 2023-05-21 NOTE — Progress Notes (Signed)
Casey Sellers 48 y.o.   Chief Complaint  Patient presents with   Cough    Patient states started Saturday, cough, fever, slight sore throat. Patient is taking Nyquil helps for a moment hasn't help.      HISTORY OF PRESENT ILLNESS: Acute problem visit today. This is a 48 y.o. male complaining of flulike symptoms that started last Saturday, 4 days ago.  Feeling worse today. Complaining of fever, productive cough, body aches, sore throat. Able to eat and drink.  Denies abdominal pain, nausea or vomiting. Non-smoker.  No other associated symptoms. No other complaints or medical concerns today.  Cough Associated symptoms include a fever and a sore throat. Pertinent negatives include no chest pain, headaches or rash.     Prior to Admission medications   Medication Sig Start Date End Date Taking? Authorizing Provider  acetaminophen (TYLENOL) 500 MG tablet Take 500 mg by mouth every 6 (six) hours as needed. Patient not taking: Reported on 05/04/2022    [provider]  amLODipine (NORVASC) 5 MG tablet Take 1 tablet (5 mg total) by mouth daily. Patient not taking: Reported on 05/21/2023 05/10/22   Georgina Quint, MD  benzonatate (TESSALON) 200 MG capsule Take 1 capsule (200 mg total) by mouth 2 (two) times daily as needed for cough. Patient not taking: Reported on 05/21/2023 07/31/22   Georgina Quint, MD  fluticasone [facility]) 50 MCG/ACT nasal spray Place 2 sprays into both nostrils daily. Patient not taking: Reported on 05/04/2022 03/07/22   Margaretann Loveless, PA-C  HYDROcodone bit-homatropine [facility]) 5-1.5 MG/5ML syrup Take 5 mLs by mouth at bedtime as needed for cough. Patient not taking: Reported on 05/21/2023 07/31/22   Georgina Quint, MD  naproxen (NAPROSYN) 500 MG tablet Take 1 tablet (500 mg total) by mouth 2 (two) times daily with a meal. Patient not taking: Reported on 05/04/2022 03/07/22   Margaretann Loveless, PA-C  rosuvastatin (CRESTOR) 10 MG tablet  Take 1 tablet (10 mg total) by mouth daily. Patient not taking: Reported on 05/21/2023 05/15/22   Georgina Quint, MD    Allergies  Allergen Reactions   Food     Renette Butters raisins   Red Dye #40 [facility] Red)     Patient Active Problem List   Diagnosis Date Noted   Viral respiratory infection 07/31/2022   Sinus congestion 07/31/2022   Essential hypertension 05/14/2022   Abnormal EKG 05/14/2022   Dyslipidemia 05/10/2022   Acute non-recurrent maxillary sinusitis 06/09/2017   Allergic rhinitis 06/09/2017   Elevated LDL cholesterol level 07/24/2016   Tobacco use disorder 01/13/2016   Acute cough 06/16/2014    Past Medical History:  Diagnosis Date   Abnormal EKG 05/14/2022   Allergic rhinitis 06/09/2017   Dyslipidemia 05/10/2022   Elevated LDL cholesterol level 07/24/2016   Essential hypertension 05/14/2022    Past Surgical History:  Procedure Laterality Date   KNEE SURGERY  04/02/1993   Cartiledge repair   MOUTH SURGERY N/A     Social History   Socioeconomic History   Marital status: Married    Spouse name: Not on file   Number of children: 3   Years of education: 12   Highest education level: 12th grade  Occupational History   Occupation: [employer]  Tobacco Use   Smoking status: Former    Current packs/day: 0.00    Average packs/day: 0.5 packs/day for 25.0 years (12.5 ttl pk-yrs)    Types: Cigarettes    Start date: 04/14/1992  Quit date: 04/14/2017    Years since quitting: 6.1   Smokeless tobacco: Never  Substance and Sexual Activity   Alcohol use: Yes    Alcohol/week: 2.0 standard drinks of alcohol    Types: 1 Cans of beer, 1 Shots of liquor per week    Comment: former   Drug use: No   Sexual activity: Yes    Partners: Female  Other Topics Concern   Not on file  Social History Narrative   Born and raised in [place], [place]. Currently resides in a house with his son, finace and step-son. Fish. Fun: hang out at the house.   Denies any religious  beliefs that would effect health care.    Social Drivers of Corporate investment banker Strain: Low Risk  (05/19/2023)   Overall Financial Resource Strain (CARDIA)    Difficulty of Paying Living Expenses: Not hard at all  Food Insecurity: No Food Insecurity (05/19/2023)   Hunger Vital Sign    Worried About Running Out of Food in the Last Year: Never true    Ran Out of Food in the Last Year: Never true  Transportation Needs: No Transportation Needs (05/19/2023)   PRAPARE - Administrator, Civil Service (Medical): No    Lack of Transportation (Non-Medical): No  Physical Activity: Sufficiently Active (05/19/2023)   Exercise Vital Sign    Days of Exercise per Week: 4 days    Minutes of Exercise per Session: 60 min  Stress: Stress Concern Present (05/19/2023)   [employer] of Occupational Health - Occupational Stress Questionnaire    Feeling of Stress : To some extent  Social Connections: Moderately Integrated (05/19/2023)   Social Connection and Isolation Panel [NHANES]    Frequency of Communication with Friends and Family: More than three times a week    Frequency of Social Gatherings with Friends and Family: Once a week    Attends Religious Services: 1 to 4 times per year    Active Member of [employer] or Organizations: No    Attends Engineer, structural: Not on file    Marital Status: Married  Catering manager Violence: Not on file    Family History  Problem Relation Age of Onset   Hypertension Mother    Diabetes Mother    High Cholesterol Mother    Arthritis Mother    Diabetes Father    High Cholesterol Father    Hypertension Father    Hypertension Sister    Hypertension Brother    Stroke Maternal Aunt      Review of Systems  Constitutional:  Positive for fever.  HENT:  Positive for congestion and sore throat.   Respiratory:  Positive for cough.   Cardiovascular: Negative.  Negative for chest pain and palpitations.  Gastrointestinal:  Negative for  abdominal pain, diarrhea, nausea and vomiting.  Genitourinary: Negative.  Negative for dysuria and hematuria.  Skin: Negative.  Negative for rash.  Neurological: Negative.  Negative for dizziness and headaches.    Today's Vitals   05/21/23 0819  BP: 118/72  Pulse: (!) 101  Temp: 98.4 F (36.9 C)  TempSrc: Oral  SpO2: 97%  Weight: 173 lb (78.5 kg)  Height: 5\' 5"  (1.651 m)   Body mass index is 28.79 kg/m.   Physical Exam Vitals reviewed.  Constitutional:      Appearance: Normal appearance.  HENT:     Head: Normocephalic.     Right Ear: Tympanic membrane, ear canal and external ear normal.  Left Ear: Tympanic membrane, ear canal and external ear normal.     Mouth/Throat:     Mouth: Mucous membranes are moist.     Pharynx: Oropharynx is clear.  Eyes:     Extraocular Movements: Extraocular movements intact.     Conjunctiva/sclera: Conjunctivae normal.     Pupils: Pupils are equal, round, and reactive to light.  Cardiovascular:     Rate and Rhythm: Normal rate and regular rhythm.     Pulses: Normal pulses.     Heart sounds: Normal heart sounds.  Pulmonary:     Effort: Pulmonary effort is normal.     Breath sounds: Normal breath sounds.  Abdominal:     Palpations: Abdomen is soft.     Tenderness: There is no abdominal tenderness.  Musculoskeletal:     Cervical back: No tenderness.  Lymphadenopathy:     Cervical: No cervical adenopathy.  Skin:    General: Skin is warm and dry.     Capillary Refill: Capillary refill takes less than 2 seconds.  Neurological:     General: No focal deficit present.     Mental Status: He is alert and oriented to person, place, and time.  Psychiatric:        Mood and Affect: Mood normal.        Behavior: Behavior normal.      ASSESSMENT & PLAN: A total of 33 minutes was spent with the patient and counseling/coordination of care regarding preparing for this visit, review of most recent office visit notes, review of most recent  blood work results, diagnosis of lower respiratory infection and need for antibiotics, symptom management, prognosis, documentation and need for follow-up if no better or worse during the next several days  Problem List Items Addressed This Visit       Respiratory   Lower respiratory infection - Primary   Clinically stable.  No red flag signs or symptoms. Negative COVID and flu tests. No signs of pneumonia. Viral respiratory infection with secondary bacterial infection Recommend daily azithromycin for 5 days Symptom management discussed Advised to contact the office if no better or worse during the next several days      Relevant Orders   Comprehensive metabolic panel   CBC with Differential/Platelet   Hemoglobin A1c     Other   Flu-like symptoms   Symptom management discussed Advised to rest and stay well-hydrated May take over-the-counter Mucinex DM as needed Tylenol and or Advil as needed      Relevant Orders   Comprehensive metabolic panel   CBC with Differential/Platelet   Hemoglobin A1c   Patient Instructions  Viral Respiratory Infection A viral respiratory infection is an illness that affects parts of the body that are used for breathing. These include the lungs, nose, and throat. It is caused by a germ called a virus. Some examples of this kind of infection are: A cold. The flu (influenza). A respiratory syncytial virus (RSV) infection. What are the causes? This condition is caused by a virus. It spreads from person to person. You can get the virus if: You breathe in droplets from someone who is sick. You come in contact with people who are sick. You touch mucus or other fluid from a person who is sick. What are the signs or symptoms? Symptoms of this condition include: A stuffy or runny nose. A sore throat. A cough. Shortness of breath. Trouble breathing. Yellow or green fluid in the nose. Other symptoms may include: A fever. Sweating or  chills. Tiredness (fatigue). Achy muscles. A headache. How is this treated? This condition may be treated with: Medicines that treat viruses. Medicines that make it easy to breathe. Medicines that are sprayed into the nose. Acetaminophen or NSAIDs, such as ibuprofen, to treat fever. Follow these instructions at home: Managing pain and congestion Take over-the-counter and prescription medicines only as told by your doctor. If you have a sore throat, gargle with salt water. Do this 3-4 times a day or as needed. To make salt water, dissolve -1 tsp (3-6 g) of salt in 1 cup (237 mL) of warm water. Make sure that all the salt dissolves. Use nose drops made from salt water. This helps with stuffiness (congestion). It also helps soften the skin around your nose. Take 2 tsp (10 mL) of honey at bedtime to lessen coughing at night. Do not give honey to children who are younger than 62 year old. Drink enough fluid to keep your pee (urine) pale yellow. General instructions  Rest as much as possible. Do not drink alcohol. Do not smoke or use any products that contain nicotine or tobacco. If you need help quitting, ask your doctor. Keep all follow-up visits. How is this prevented?     Get a flu shot every year. Ask your doctor when you should get your flu shot. Do not let other people get your germs. If you are sick: Wash your hands with soap and water often. Wash your hands after you cough or sneeze. Wash hands for at least 20 seconds. If you cannot use soap and water, use hand sanitizer. Cover your mouth when you cough. Cover your nose and mouth when you sneeze. Do not share cups or eating utensils. Clean commonly used objects often. Clean commonly touched surfaces. Stay home from work or school. Avoid contact with people who are sick during cold and flu season. This is in fall and winter. Get help if: Your symptoms last for 10 days or longer. Your symptoms get worse over time. You have  very bad pain in your face or forehead. Parts of your jaw or neck get very swollen. You have shortness of breath. Get help right away if: You feel pain or pressure in your chest. You have trouble breathing. You faint or feel like you will faint. You keep vomiting and it gets worse. You feel confused. These symptoms may be an emergency. Get help right away. Call your local emergency services (911 in the [place] Do not wait to see if the symptoms will go away. Do not drive yourself to the hospital. Summary A viral respiratory infection is an illness that affects parts of the body that are used for breathing. Examples of this illness include a cold, the flu, and a respiratory syncytial virus (RSV) infection. The infection can cause a runny nose, cough, sore throat, and fever. Follow what your doctor tells you about taking medicines, drinking lots of fluid, washing your hands, resting at home, and avoiding people who are sick. This information is not intended to replace advice given to you by your health care provider. Make sure you discuss any questions you have with your health care provider. Document Revised: 06/23/2020 Document Reviewed: 06/23/2020 Elsevier Patient Education  2024 Elsevier Inc.    Edwina Barth, MD [facility]

## 2023-05-21 NOTE — Assessment & Plan Note (Signed)
Clinically stable.  No red flag signs or symptoms. Negative COVID and flu tests. No signs of pneumonia. Viral respiratory infection with secondary bacterial infection Recommend daily azithromycin for 5 days Symptom management discussed Advised to contact the office if no better or worse during the next several days

## 2023-05-21 NOTE — Patient Instructions (Signed)

## 2023-07-08 ENCOUNTER — Encounter: Payer: Self-pay | Admitting: Emergency Medicine

## 2023-07-08 ENCOUNTER — Ambulatory Visit (INDEPENDENT_AMBULATORY_CARE_PROVIDER_SITE_OTHER): Payer: 59 | Admitting: Emergency Medicine

## 2023-07-08 VITALS — BP 118/82 | HR 91 | Temp 98.3°F | Ht 65.0 in | Wt 181.0 lb

## 2023-07-08 DIAGNOSIS — E785 Hyperlipidemia, unspecified: Secondary | ICD-10-CM | POA: Diagnosis not present

## 2023-07-08 DIAGNOSIS — Z0000 Encounter for general adult medical examination without abnormal findings: Secondary | ICD-10-CM

## 2023-07-08 DIAGNOSIS — Z130 Encounter for screening for diseases of the blood and blood-forming organs and certain disorders involving the immune mechanism: Secondary | ICD-10-CM

## 2023-07-08 DIAGNOSIS — Z1329 Encounter for screening for other suspected endocrine disorder: Secondary | ICD-10-CM

## 2023-07-08 DIAGNOSIS — Z125 Encounter for screening for malignant neoplasm of prostate: Secondary | ICD-10-CM | POA: Diagnosis not present

## 2023-07-08 DIAGNOSIS — Z13228 Encounter for screening for other metabolic disorders: Secondary | ICD-10-CM

## 2023-07-08 LAB — CBC WITH DIFFERENTIAL/PLATELET
Basophils Absolute: 0 10*3/uL (ref 0.0–0.1)
Basophils Relative: 0.7 % (ref 0.0–3.0)
Eosinophils Absolute: 0.2 10*3/uL (ref 0.0–0.7)
Eosinophils Relative: 4.2 % (ref 0.0–5.0)
HCT: 43.1 % (ref 39.0–52.0)
Hemoglobin: 14.7 g/dL (ref 13.0–17.0)
Lymphocytes Relative: 37.5 % (ref 12.0–46.0)
Lymphs Abs: 1.5 10*3/uL (ref 0.7–4.0)
MCHC: 34.1 g/dL (ref 30.0–36.0)
MCV: 91 fl (ref 78.0–100.0)
Monocytes Absolute: 0.5 10*3/uL (ref 0.1–1.0)
Monocytes Relative: 11.5 % (ref 3.0–12.0)
Neutro Abs: 1.8 10*3/uL (ref 1.4–7.7)
Neutrophils Relative %: 46.1 % (ref 43.0–77.0)
Platelets: 230 10*3/uL (ref 150.0–400.0)
RBC: 4.74 Mil/uL (ref 4.22–5.81)
RDW: 13.4 % (ref 11.5–15.5)
WBC: 4 10*3/uL (ref 4.0–10.5)

## 2023-07-08 LAB — COMPREHENSIVE METABOLIC PANEL WITH GFR
ALT: 17 U/L (ref 0–53)
AST: 20 U/L (ref 0–37)
Albumin: 4.5 g/dL (ref 3.5–5.2)
Alkaline Phosphatase: 58 U/L (ref 39–117)
BUN: 14 mg/dL (ref 6–23)
CO2: 27 meq/L (ref 19–32)
Calcium: 9.6 mg/dL (ref 8.4–10.5)
Chloride: 103 meq/L (ref 96–112)
Creatinine, Ser: 0.92 mg/dL (ref 0.40–1.50)
GFR: 98.2 mL/min (ref >60.00)
Glucose, Bld: 91 mg/dL (ref 70–99)
Potassium: 4.2 meq/L (ref 3.5–5.1)
Sodium: 139 meq/L (ref 135–145)
Total Bilirubin: 1.1 mg/dL (ref 0.2–1.2)
Total Protein: 7.3 g/dL (ref 6.0–8.3)

## 2023-07-08 LAB — LIPID PANEL
Cholesterol: 212 mg/dL — ABNORMAL HIGH (ref 0–200)
HDL: 39.4 mg/dL (ref >39.00)
LDL Cholesterol: 136 mg/dL — ABNORMAL HIGH (ref 0–99)
NonHDL: 172.99
Total CHOL/HDL Ratio: 5
Triglycerides: 187 mg/dL — ABNORMAL HIGH (ref 0.0–149.0)
VLDL: 37.4 mg/dL (ref 0.0–40.0)

## 2023-07-08 LAB — HEMOGLOBIN A1C: Hgb A1c MFr Bld: 5.8 % (ref 4.6–6.5)

## 2023-07-08 LAB — PSA: PSA: 0.81 ng/mL (ref 0.10–4.00)

## 2023-07-08 LAB — TSH: TSH: 2.33 u[IU]/mL (ref 0.35–5.50)

## 2023-07-08 LAB — VITAMIN B12: Vitamin B-12: 406 pg/mL (ref 211–911)

## 2023-07-08 LAB — VITAMIN D 25 HYDROXY (VIT D DEFICIENCY, FRACTURES): VITD: 28.71 ng/mL — ABNORMAL LOW (ref 30.00–100.00)

## 2023-07-08 NOTE — Progress Notes (Signed)
 Casey Sellers 48 y.o.   Chief Complaint  Patient presents with   Annual Exam    Patient is here physical. No other concerns     HISTORY OF PRESENT ILLNESS: This is a 48 y.o. male here for annual exam. Overall doing well.  Has no complaints or medical concerns today. Had colonoscopy by [facility] physicians last year and was told to come back in 10 years.  HPI   Prior to Admission medications   Medication Sig Start Date End Date Taking? Authorizing Provider  acetaminophen (TYLENOL) 500 MG tablet Take 500 mg by mouth every 6 (six) hours as needed. Patient not taking: Reported on 05/04/2022    [provider]  amLODipine (NORVASC) 5 MG tablet Take 1 tablet (5 mg total) by mouth daily. Patient not taking: Reported on 05/14/2022 05/10/22   Georgina Quint, MD  azithromycin [facility] 250 MG tablet Sig as indicated Patient not taking: Reported on 07/08/2023 05/21/23   Georgina Quint, MD  benzonatate (TESSALON) 200 MG capsule Take 1 capsule (200 mg total) by mouth 2 (two) times daily as needed for cough. Patient not taking: Reported on 07/08/2023 07/31/22   Georgina Quint, MD  fluticasone [facility]) 50 MCG/ACT nasal spray Place 2 sprays into both nostrils daily. Patient not taking: Reported on 05/04/2022 03/07/22   Margaretann Loveless, PA-C  HYDROcodone bit-homatropine [facility]) 5-1.5 MG/5ML syrup Take 5 mLs by mouth at bedtime as needed for cough. Patient not taking: Reported on 07/08/2023 07/31/22   Georgina Quint, MD  naproxen (NAPROSYN) 500 MG tablet Take 1 tablet (500 mg total) by mouth 2 (two) times daily with a meal. Patient not taking: Reported on 05/04/2022 03/07/22   Margaretann Loveless, PA-C  rosuvastatin (CRESTOR) 10 MG tablet Take 1 tablet (10 mg total) by mouth daily. Patient not taking: Reported on 07/08/2023 05/15/22   Georgina Quint, MD    Allergies  Allergen Reactions   Food     Renette Butters raisins   Red Dye #40 [facility] Red)     Patient Active  Problem List   Diagnosis Date Noted   Essential hypertension 05/14/2022   Abnormal EKG 05/14/2022   Dyslipidemia 05/10/2022   Elevated LDL cholesterol level 07/24/2016   Tobacco use disorder 01/13/2016    Past Medical History:  Diagnosis Date   Abnormal EKG 05/14/2022   Allergic rhinitis 06/09/2017   Dyslipidemia 05/10/2022   Elevated LDL cholesterol level 07/24/2016   Essential hypertension 05/14/2022    Past Surgical History:  Procedure Laterality Date   KNEE SURGERY  04/02/1993   Cartiledge repair   MOUTH SURGERY N/A     Social History   Socioeconomic History   Marital status: Married    Spouse name: Not on file   Number of children: 3   Years of education: 12   Highest education level: 12th grade  Occupational History   Occupation: [employer]  Tobacco Use   Smoking status: Former    Current packs/day: 0.00    Average packs/day: 0.5 packs/day for 25.0 years (12.5 ttl pk-yrs)    Types: Cigarettes    Start date: 04/14/1992    Quit date: 04/14/2017    Years since quitting: 6.2   Smokeless tobacco: Never  Substance and Sexual Activity   Alcohol use: Yes    Alcohol/week: 2.0 standard drinks of alcohol    Types: 1 Cans of beer, 1 Shots of liquor per week    Comment: former   Drug use: No  Sexual activity: Yes    Partners: Female  Other Topics Concern   Not on file  Social History Narrative   Born and raised in [place], [place]. Currently resides in a house with his son, finace and step-son. Fish. Fun: hang out at the house.   Denies any religious beliefs that would effect health care.    Social Drivers of Corporate investment banker Strain: Low Risk  (05/19/2023)   Overall Financial Resource Strain (CARDIA)    Difficulty of Paying Living Expenses: Not hard at all  Food Insecurity: No Food Insecurity (05/19/2023)   Hunger Vital Sign    Worried About Running Out of Food in the Last Year: Never true    Ran Out of Food in the Last Year: Never true   Transportation Needs: No Transportation Needs (05/19/2023)   PRAPARE - Administrator, Civil Service (Medical): No    Lack of Transportation (Non-Medical): No  Physical Activity: Sufficiently Active (05/19/2023)   Exercise Vital Sign    Days of Exercise per Week: 4 days    Minutes of Exercise per Session: 60 min  Stress: Stress Concern Present (05/19/2023)   [employer] of Occupational Health - Occupational Stress Questionnaire    Feeling of Stress : To some extent  Social Connections: Moderately Integrated (05/19/2023)   Social Connection and Isolation Panel [NHANES]    Frequency of Communication with Friends and Family: More than three times a week    Frequency of Social Gatherings with Friends and Family: Once a week    Attends Religious Services: 1 to 4 times per year    Active Member of [employer] or Organizations: No    Attends Engineer, structural: Not on file    Marital Status: Married  Catering manager Violence: Not on file    Family History  Problem Relation Age of Onset   Hypertension Mother    Diabetes Mother    High Cholesterol Mother    Arthritis Mother    Diabetes Father    High Cholesterol Father    Hypertension Father    Hypertension Sister    Hypertension Brother    Stroke Maternal Aunt      Review of Systems  Constitutional: Negative.  Negative for chills and fever.  HENT: Negative.  Negative for congestion and sore throat.   Respiratory: Negative.  Negative for cough and shortness of breath.   Cardiovascular: Negative.  Negative for chest pain and palpitations.  Gastrointestinal:  Negative for abdominal pain, diarrhea, nausea and vomiting.  Genitourinary: Negative.  Negative for dysuria and hematuria.  Skin: Negative.  Negative for rash.  Neurological: Negative.  Negative for dizziness and headaches.  All other systems reviewed and are negative.   Vitals:   07/08/23 0809  BP: 118/82  Pulse: 91  Temp: 98.3 F (36.8 C)  SpO2:  96%    Physical Exam Vitals reviewed.  Constitutional:      Appearance: Normal appearance.  HENT:     Head: Normocephalic.     Right Ear: Tympanic membrane, ear canal and external ear normal.     Left Ear: Tympanic membrane, ear canal and external ear normal.     Mouth/Throat:     Mouth: Mucous membranes are moist.     Pharynx: Oropharynx is clear.  Eyes:     Extraocular Movements: Extraocular movements intact.     Conjunctiva/sclera: Conjunctivae normal.     Pupils: Pupils are equal, round, and reactive to light.  Cardiovascular:  Rate and Rhythm: Normal rate and regular rhythm.     Pulses: Normal pulses.     Heart sounds: Normal heart sounds.  Pulmonary:     Effort: Pulmonary effort is normal.     Breath sounds: Normal breath sounds.  Abdominal:     Palpations: Abdomen is soft.     Tenderness: There is no abdominal tenderness.  Musculoskeletal:        General: Normal range of motion.     Cervical back: No tenderness.  Lymphadenopathy:     Cervical: No cervical adenopathy.  Skin:    General: Skin is warm and dry.     Capillary Refill: Capillary refill takes less than 2 seconds.  Neurological:     General: No focal deficit present.     Mental Status: He is alert and oriented to person, place, and time.  Psychiatric:        Mood and Affect: Mood normal.        Behavior: Behavior normal.      ASSESSMENT & PLAN: Problem List Items Addressed This Visit       Other   Dyslipidemia   Relevant Orders   Lipid panel   Other Visit Diagnoses       Routine general medical examination at a health care facility    -  Primary   Relevant Orders   Lipid panel   Vitamin B12   VITAMIN D 25 Hydroxy (Vit-D Deficiency, Fractures)   TSH   PSA     Screening for prostate cancer       Relevant Orders   PSA     Screening for endocrine, metabolic and immunity disorder       Relevant Orders   Vitamin B12   VITAMIN D 25 Hydroxy (Vit-D Deficiency, Fractures)   TSH       Modifiable risk factors discussed with patient. Anticipatory guidance according to age provided. The following topics were also discussed: Social Determinants of Health Smoking.  Non-smoker Diet and nutrition Benefits of exercise Cancer screening and review of most recent colonoscopy report done last year by [facility] physicians. Vaccinations review and recommendations Cardiovascular risk assessment Mental health including depression and anxiety Fall and accident prevention  Patient Instructions  Health Maintenance, Male Adopting a healthy lifestyle and getting preventive care are important in promoting health and wellness. Ask your health care provider about: The right schedule for you to have regular tests and exams. Things you can do on your own to prevent diseases and keep yourself healthy. What should I know about diet, weight, and exercise? Eat a healthy diet  Eat a diet that includes plenty of vegetables, fruits, low-fat dairy products, and lean protein. Do not eat a lot of foods that are high in solid fats, added sugars, or sodium. Maintain a healthy weight Body mass index (BMI) is a measurement that can be used to identify possible weight problems. It estimates body fat based on height and weight. Your health care provider can help determine your BMI and help you achieve or maintain a healthy weight. Get regular exercise Get regular exercise. This is one of the most important things you can do for your health. Most adults should: Exercise for at least 150 minutes each week. The exercise should increase your heart rate and make you sweat (moderate-intensity exercise). Do strengthening exercises at least twice a week. This is in addition to the moderate-intensity exercise. Spend less time sitting. Even light physical activity can be beneficial. Watch cholesterol and blood  lipids Have your blood tested for lipids and cholesterol at 49 years of age, then have this test every 5  years. You may need to have your cholesterol levels checked more often if: Your lipid or cholesterol levels are high. You are older than 49 years of age. You are at high risk for heart disease. What should I know about cancer screening? Many types of cancers can be detected early and may often be prevented. Depending on your health history and family history, you may need to have cancer screening at various ages. This may include screening for: Colorectal cancer. Prostate cancer. Skin cancer. Lung cancer. What should I know about heart disease, diabetes, and high blood pressure? Blood pressure and heart disease High blood pressure causes heart disease and increases the risk of stroke. This is more likely to develop in people who have high blood pressure readings or are overweight. Talk with your health care provider about your target blood pressure readings. Have your blood pressure checked: Every 3-5 years if you are 61-11 years of age. Every year if you are 37 years old or older. If you are between the ages of 54 and 46 and are a current or former smoker, ask your health care provider if you should have a one-time screening for abdominal aortic aneurysm (AAA). Diabetes Have regular diabetes screenings. This checks your fasting blood sugar level. Have the screening done: Once every three years after age 50 if you are at a normal weight and have a low risk for diabetes. More often and at a younger age if you are overweight or have a high risk for diabetes. What should I know about preventing infection? Hepatitis B If you have a higher risk for hepatitis B, you should be screened for this virus. Talk with your health care provider to find out if you are at risk for hepatitis B infection. Hepatitis C Blood testing is recommended for: Everyone born from [birth year] through 1966. Anyone with known risk factors for hepatitis C. Sexually transmitted infections (STIs) You should be screened each  year for STIs, including gonorrhea and chlamydia, if: You are sexually active and are younger than 49 years of age. You are older than 49 years of age and your health care provider tells you that you are at risk for this type of infection. Your sexual activity has changed since you were last screened, and you are at increased risk for chlamydia or gonorrhea. Ask your health care provider if you are at risk. Ask your health care provider about whether you are at high risk for HIV. Your health care provider may recommend a prescription medicine to help prevent HIV infection. If you choose to take medicine to prevent HIV, you should first get tested for HIV. You should then be tested every 3 months for as long as you are taking the medicine. Follow these instructions at home: Alcohol use Do not drink alcohol if your health care provider tells you not to drink. If you drink alcohol: Limit how much you have to 0-2 drinks a day. Know how much alcohol is in your drink. In the [place] one drink equals one 12 oz bottle of beer (355 mL), one 5 oz glass of wine (148 mL), or one 1 oz glass of hard liquor (44 mL). Lifestyle Do not use any products that contain nicotine or tobacco. These products include cigarettes, chewing tobacco, and vaping devices, such as e-cigarettes. If you need help quitting, ask your health care provider. Do not  use street drugs. Do not share needles. Ask your health care provider for help if you need support or information about quitting drugs. General instructions Schedule regular health, dental, and eye exams. Stay current with your vaccines. Tell your health care provider if: You often feel depressed. You have ever been abused or do not feel safe at home. Summary Adopting a healthy lifestyle and getting preventive care are important in promoting health and wellness. Follow your health care provider's instructions about healthy diet, exercising, and getting tested or screened  for diseases. Follow your health care provider's instructions on monitoring your cholesterol and blood pressure. This information is not intended to replace advice given to you by your health care provider. Make sure you discuss any questions you have with your health care provider. Document Revised: 08/08/2020 Document Reviewed: 08/08/2020 Elsevier Patient Education  2024 Elsevier Inc.     Edwina Barth, MD [facility]

## 2023-07-08 NOTE — Patient Instructions (Signed)
 Health Maintenance, Male  Adopting a healthy lifestyle and getting preventive care are important in promoting health and wellness. Ask your health care provider about:  The right schedule for you to have regular tests and exams.  Things you can do on your own to prevent diseases and keep yourself healthy.  What should I know about diet, weight, and exercise?  Eat a healthy diet    Eat a diet that includes plenty of vegetables, fruits, low-fat dairy products, and lean protein.  Do not eat a lot of foods that are high in solid fats, added sugars, or sodium.  Maintain a healthy weight  Body mass index (BMI) is a measurement that can be used to identify possible weight problems. It estimates body fat based on height and weight. Your health care provider can help determine your BMI and help you achieve or maintain a healthy weight.  Get regular exercise  Get regular exercise. This is one of the most important things you can do for your health. Most adults should:  Exercise for at least 150 minutes each week. The exercise should increase your heart rate and make you sweat (moderate-intensity exercise).  Do strengthening exercises at least twice a week. This is in addition to the moderate-intensity exercise.  Spend less time sitting. Even light physical activity can be beneficial.  Watch cholesterol and blood lipids  Have your blood tested for lipids and cholesterol at 49 years of age, then have this test every 5 years.  You may need to have your cholesterol levels checked more often if:  Your lipid or cholesterol levels are high.  You are older than 49 years of age.  You are at high risk for heart disease.  What should I know about cancer screening?  Many types of cancers can be detected early and may often be prevented. Depending on your health history and family history, you may need to have cancer screening at various ages. This may include screening for:  Colorectal cancer.  Prostate cancer.  Skin cancer.  Lung  cancer.  What should I know about heart disease, diabetes, and high blood pressure?  Blood pressure and heart disease  High blood pressure causes heart disease and increases the risk of stroke. This is more likely to develop in people who have high blood pressure readings or are overweight.  Talk with your health care provider about your target blood pressure readings.  Have your blood pressure checked:  Every 3-5 years if you are 9-95 years of age.  Every year if you are 85 years old or older.  If you are between the ages of 29 and 29 and are a current or former smoker, ask your health care provider if you should have a one-time screening for abdominal aortic aneurysm (AAA).  Diabetes  Have regular diabetes screenings. This checks your fasting blood sugar level. Have the screening done:  Once every three years after age 23 if you are at a normal weight and have a low risk for diabetes.  More often and at a younger age if you are overweight or have a high risk for diabetes.  What should I know about preventing infection?  Hepatitis B  If you have a higher risk for hepatitis B, you should be screened for this virus. Talk with your health care provider to find out if you are at risk for hepatitis B infection.  Hepatitis C  Blood testing is recommended for:  Everyone born from [birth year] through 1966.  Anyone  with known risk factors for hepatitis C.  Sexually transmitted infections (STIs)  You should be screened each year for STIs, including gonorrhea and chlamydia, if:  You are sexually active and are younger than 49 years of age.  You are older than 49 years of age and your health care provider tells you that you are at risk for this type of infection.  Your sexual activity has changed since you were last screened, and you are at increased risk for chlamydia or gonorrhea. Ask your health care provider if you are at risk.  Ask your health care provider about whether you are at high risk for HIV. Your health care provider  may recommend a prescription medicine to help prevent HIV infection. If you choose to take medicine to prevent HIV, you should first get tested for HIV. You should then be tested every 3 months for as long as you are taking the medicine.  Follow these instructions at home:  Alcohol use  Do not drink alcohol if your health care provider tells you not to drink.  If you drink alcohol:  Limit how much you have to 0-2 drinks a day.  Know how much alcohol is in your drink. In the [place] one drink equals one 12 oz bottle of beer (355 mL), one 5 oz glass of wine (148 mL), or one 1 oz glass of hard liquor (44 mL).  Lifestyle  Do not use any products that contain nicotine or tobacco. These products include cigarettes, chewing tobacco, and vaping devices, such as e-cigarettes. If you need help quitting, ask your health care provider.  Do not use street drugs.  Do not share needles.  Ask your health care provider for help if you need support or information about quitting drugs.  General instructions  Schedule regular health, dental, and eye exams.  Stay current with your vaccines.  Tell your health care provider if:  You often feel depressed.  You have ever been abused or do not feel safe at home.  Summary  Adopting a healthy lifestyle and getting preventive care are important in promoting health and wellness.  Follow your health care provider's instructions about healthy diet, exercising, and getting tested or screened for diseases.  Follow your health care provider's instructions on monitoring your cholesterol and blood pressure.  This information is not intended to replace advice given to you by your health care provider. Make sure you discuss any questions you have with your health care provider.  Document Revised: 08/08/2020 Document Reviewed: 08/08/2020  Elsevier Patient Education  2024 [employer].

## 2023-07-09 ENCOUNTER — Other Ambulatory Visit: Payer: Self-pay | Admitting: Emergency Medicine

## 2023-07-09 DIAGNOSIS — E785 Hyperlipidemia, unspecified: Secondary | ICD-10-CM

## 2023-07-09 MED ORDER — ROSUVASTATIN CALCIUM 10 MG PO TABS: 10.0000 mg | ORAL_TABLET | Freq: Every day | ORAL | 3 refills | Status: DC

## 2024-01-01 ENCOUNTER — Telehealth: Admitting: Family Medicine

## 2024-01-01 DIAGNOSIS — J069 Acute upper respiratory infection, unspecified: Secondary | ICD-10-CM

## 2024-01-01 MED ORDER — BENZONATATE 100 MG PO CAPS: 100.0000 mg | ORAL_CAPSULE | Freq: Three times a day (TID) | ORAL | 0 refills | Status: DC | PRN

## 2024-01-01 MED ORDER — IPRATROPIUM BROMIDE 0.03 % NA SOLN: 2.0000 | Freq: Two times a day (BID) | NASAL | 0 refills | Status: AC

## 2024-01-01 NOTE — Progress Notes (Signed)
 We are sorry you are not feeling well.  Here is how we plan to help!  Based on what you have shared with me, it looks like you may have a viral upper respiratory infection.  Upper respiratory infections are caused by a large number of viruses; however, rhinovirus is the most common cause.   Symptoms vary from person to person, with common symptoms including sore throat, cough, and fatigue or lack of energy.  A low-grade fever of up to 100.4 may present, but is often uncommon.  Symptoms vary however, and are closely related to a person's age or underlying illnesses.  The most common symptoms associated with an upper respiratory infection are nasal discharge or congestion, cough, sneezing, headache and pressure in the ears and face.  These symptoms usually persist for about 3 to 10 days, but can last up to 2 weeks.  It is important to know that upper respiratory infections do not cause serious illness or complications in most cases.    Upper respiratory infections can be transmitted from person to person, with the most common method of transmission being a person's hands.  The virus is able to live on the skin and can infect other persons for up to 2 hours after direct contact.  Also, these can be transmitted when someone coughs or sneezes; thus, it is important to cover the mouth to reduce this risk.  To keep the spread of the illness at bay, good hand hygiene is very important.  This is an infection that is most likely caused by a virus. There are no specific treatments other than to help you with the symptoms until the infection runs its course.  We are sorry you are not feeling well.  Here is how we plan to help!   For nasal congestion, you may use an oral decongestants such as Mucinex  D or if you have glaucoma or high blood pressure use plain Mucinex .  Saline nasal spray or nasal drops can help and can safely be used as often as needed for congestion.  For your congestion, I have prescribed Ipratropium  Bromide nasal spray 0.03% two sprays in each nostril 2-3 times a day  If you do not have a history of heart disease, hypertension, diabetes or thyroid  disease, prostate/bladder issues or glaucoma, you may also use Sudafed to treat nasal congestion.  It is highly recommended that you consult with a pharmacist or your primary care physician to ensure this medication is safe for you to take.     If you have a cough, you may use cough suppressants such as Delsym and Robitussin.  If you have glaucoma or high blood pressure, you can also use Coricidin HBP.   For cough I have prescribed for you A prescription cough medication called Tessalon  Perles 100 mg. You may take 1-2 capsules every 8 hours as needed for cough  If you have a sore or scratchy throat, use a saltwater gargle-  to  teaspoon of salt dissolved in a 4-ounce to 8-ounce glass of warm water.  Gargle the solution for approximately 15-30 seconds and then spit.  It is important not to swallow the solution.  You can also use throat lozenges/cough drops and Chloraseptic spray to help with throat pain or discomfort.  Warm or cold liquids can also be helpful in relieving throat pain.  For headache, pain or general discomfort, you can use Ibuprofen or Tylenol as directed.   Some authorities believe that zinc sprays or the use of Echinacea  may shorten the course of your symptoms.   HOME CARE Only take medications as instructed by your medical team. Be sure to drink plenty of fluids. Water is fine as well as fruit juices, sodas and electrolyte beverages. You may want to stay away from caffeine or alcohol. If you are nauseated, try taking small sips of liquids. How do you know if you are getting enough fluid? Your urine should be a pale yellow or almost colorless. Get rest. Taking a steamy shower or using a humidifier may help nasal congestion and ease sore throat pain. You can place a towel over your head and breathe in the steam from hot water coming  from a faucet. Using a saline nasal spray works much the same way. Cough drops, hard candies and sore throat lozenges may ease your cough. Avoid close contacts especially the very young and the elderly Cover your mouth if you cough or sneeze Always remember to wash your hands.   GET HELP RIGHT AWAY IF: You develop worsening fever. If your symptoms do not improve within 10 days You develop yellow or green discharge from your nose over 3 days. You have coughing fits You develop a severe head ache or visual changes. You develop shortness of breath, difficulty breathing or start having chest pain Your symptoms persist after you have completed your treatment plan  MAKE SURE YOU  Understand these instructions. Will watch your condition. Will get help right away if you are not doing well or get worse.  Your e-visit answers were reviewed by a board certified advanced clinical practitioner to complete your personal care plan. Depending upon the condition, your plan could have included both over the counter or prescription medications. Please review your pharmacy choice. If there is a problem, you may call our nursing hot line at and have the prescription routed to another pharmacy. Your safety is important to us . If you have drug allergies check your prescription carefully.   You can use MyChart to ask questions about today's visit, request a non-urgent call back, or ask for a work or school excuse for 24 hours related to this e-Visit. If it has been greater than 24 hours you will need to follow up with your provider, or enter a new e-Visit to address those concerns. You will get an e-mail in the next two days asking about your experience.  I hope that your e-visit has been valuable and will speed your recovery. Thank you for using e-visits.   I have spent 5 minutes in review of e-visit questionnaire, review and updating patient chart, medical decision making and response to patient.   Chiquita CHRISTELLA Barefoot, NP

## 2024-03-22 ENCOUNTER — Encounter: Payer: Self-pay | Admitting: Emergency Medicine

## 2024-03-23 ENCOUNTER — Other Ambulatory Visit: Payer: Self-pay

## 2024-03-23 DIAGNOSIS — E785 Hyperlipidemia, unspecified: Secondary | ICD-10-CM

## 2024-03-23 MED ORDER — ROSUVASTATIN CALCIUM 10 MG PO TABS: 10.0000 mg | ORAL_TABLET | Freq: Every day | ORAL | 3 refills | Status: AC

## 2024-04-14 ENCOUNTER — Encounter: Payer: Self-pay | Admitting: Emergency Medicine

## 2024-04-14 ENCOUNTER — Ambulatory Visit: Admitting: Emergency Medicine

## 2024-04-14 ENCOUNTER — Ambulatory Visit: Payer: Self-pay | Admitting: Emergency Medicine

## 2024-04-14 VITALS — BP 113/75 | HR 108 | Temp 99.3°F | Ht 65.0 in | Wt 185.0 lb

## 2024-04-14 DIAGNOSIS — B349 Viral infection, unspecified: Secondary | ICD-10-CM

## 2024-04-14 DIAGNOSIS — R509 Fever, unspecified: Secondary | ICD-10-CM | POA: Diagnosis not present

## 2024-04-14 LAB — URINALYSIS, ROUTINE W REFLEX MICROSCOPIC
Bilirubin Urine: NEGATIVE
Ketones, ur: NEGATIVE
Leukocytes,Ua: NEGATIVE
Nitrite: NEGATIVE
Specific Gravity, Urine: >=1.03 — AB (ref 1.000–1.030)
Total Protein, Urine: NEGATIVE
Urine Glucose: NEGATIVE
Urobilinogen, UA: 0.2 (ref 0.0–1.0)
pH: 5.5 (ref 5.0–8.0)

## 2024-04-14 LAB — CBC WITH DIFFERENTIAL/PLATELET
Basophils Absolute: 0 K/uL (ref 0.0–0.1)
Basophils Relative: 0.6 % (ref 0.0–3.0)
Eosinophils Absolute: 0 K/uL (ref 0.0–0.7)
Eosinophils Relative: 0.6 % (ref 0.0–5.0)
HCT: 43.3 % (ref 39.0–52.0)
Hemoglobin: 14.9 g/dL (ref 13.0–17.0)
Lymphocytes Relative: 25.7 % (ref 12.0–46.0)
Lymphs Abs: 1.1 K/uL (ref 0.7–4.0)
MCHC: 34.4 g/dL (ref 30.0–36.0)
MCV: 89.4 fl (ref 78.0–100.0)
Monocytes Absolute: 0.7 K/uL (ref 0.1–1.0)
Monocytes Relative: 16.7 % — ABNORMAL HIGH (ref 3.0–12.0)
Neutro Abs: 2.4 K/uL (ref 1.4–7.7)
Neutrophils Relative %: 56.4 % (ref 43.0–77.0)
Platelets: 178 K/uL (ref 150.0–400.0)
RBC: 4.84 Mil/uL (ref 4.22–5.81)
RDW: 13.4 % (ref 11.5–15.5)
WBC: 4.3 K/uL (ref 4.0–10.5)

## 2024-04-14 LAB — COMPREHENSIVE METABOLIC PANEL WITH GFR
ALT: 22 U/L (ref 3–53)
AST: 21 U/L (ref 5–37)
Albumin: 4.5 g/dL (ref 3.5–5.2)
Alkaline Phosphatase: 54 U/L (ref 39–117)
BUN: 12 mg/dL (ref 6–23)
CO2: 27 meq/L (ref 19–32)
Calcium: 9.3 mg/dL (ref 8.4–10.5)
Chloride: 101 meq/L (ref 96–112)
Creatinine, Ser: 0.98 mg/dL (ref 0.40–1.50)
GFR: 90.54 mL/min
Glucose, Bld: 95 mg/dL (ref 70–99)
Potassium: 4.2 meq/L (ref 3.5–5.1)
Sodium: 136 meq/L (ref 135–145)
Total Bilirubin: 0.9 mg/dL (ref 0.2–1.2)
Total Protein: 7.9 g/dL (ref 6.0–8.3)

## 2024-04-14 LAB — POC COVID19 BINAXNOW: SARS Coronavirus 2 Ag: NEGATIVE

## 2024-04-14 LAB — POCT INFLUENZA A/B
Influenza A, POC: NEGATIVE
Influenza B, POC: NEGATIVE

## 2024-04-14 NOTE — Patient Instructions (Signed)
 Viral Illness, Adult Viruses are tiny germs that can get into a person's body and cause illness. There are many different types of viruses. And they cause many types of illness. Viral illnesses can range from mild to severe. They can affect various parts of the body. Short-term conditions that are caused by a virus include colds and flu (influenza) and stomach viruses. Long-term conditions that are caused by a virus include herpes, shingles, and human immunodeficiency virus (HIV) infection. A few viruses have been linked to certain cancers. What are the causes? Many types of viruses can cause illness. Viruses get into cells in your body, multiply, and cause the infected cells to work differently or die. When these cells die, they release more of the virus. When this happens, you get symptoms of the illness and the virus spreads to other cells. If the virus takes over how the cell works, it can cause the cell to divide and grow out of control. This happens when a virus causes cancer. Different viruses get into the body in different ways. You can get a virus by: Swallowing food or water that has come in contact with the virus. Breathing in droplets that have been coughed or sneezed into the air by an infected person. Touching a surface that has the virus on it and then touching your eyes, nose, or mouth. Being bitten by an insect or animal that carries the virus. Having sexual contact with a person who is infected with the virus. Being exposed to blood or fluids that contain the virus, either through an open cut or during a transfusion. If a virus enters your body, your body's disease-fighting system (immune system) will try to fight the virus. You may be at higher risk for a viral illness if your immune system is weak. What are the signs or symptoms? Symptoms depend on the type of virus and the location of the cells that it gets into. Symptoms can include: For cold and flu  viruses: Fever. Headache. Sore throat. Muscle aches. Stuffy nose (nasal congestion). Cough. For stomach (gastrointestinal) viruses: Fever. Pain in the abdomen. Nausea or vomiting. Diarrhea. For liver viruses (hepatitis): Loss of appetite. Feeling tired. Skin or the white parts of your eyes turning yellow (jaundice). For brain and spinal cord viruses: Fever. Headache. Stiff neck. Nausea and vomiting. Confusion or being sleepy. For skin viruses: Warts. Itching. Rash. For sexually transmitted viruses: Discharge. Swelling. Redness. Rash. How is this diagnosed? This condition may be diagnosed based on one or more of these: Your symptoms and medical history. A physical exam. Tests, such as: Blood tests. Tests on a sample of mucus from your lungs (sputum sample). Tests on a poop (stool) sample. Tests on a swab of body fluids or a skin sore (lesion). How is this treated? Viruses can be hard to treat because they live within cells. Antibiotics do not treat viruses because these medicines do not get inside cells. Treatment for a viral illness may include: Resting and drinking a lot of fluids. Medicines to treat symptoms. These can include over-the-counter medicine for pain and fever, medicines for cough or congestion, and medicines for diarrhea. Antiviral medicines. These medicines are available only for certain types of viruses. Some viral illnesses can be prevented with vaccinations. A common example is the flu shot. Follow these instructions at home: Medicines Take over-the-counter and prescription medicines only as told by your health care provider. If you were prescribed an antiviral medicine, take it as told by your provider. Do not stop  taking the antiviral even if you start to feel better. Know when antibiotics are needed and when they are not needed. Antibiotics do not treat viruses. You may get an antibiotic if your provider thinks that you may have, or are at risk  for, a bacterial infection and you have a viral infection. Do not ask for an antibiotic prescription if you have been diagnosed with a viral illness. Antibiotics will not make your illness go away faster. Taking antibiotics when they are not needed can lead to antibiotic resistance. When this develops, the medicine no longer works against the bacteria that it normally fights. General instructions Drink enough fluids to keep your pee (urine) pale yellow. Rest as much as possible. Return to your normal activities as told by your provider. Ask your provider what activities are safe for you. How is this prevented? To lower your risk of getting another viral illness: Wash your hands often with soap and water for at least 20 seconds. If soap and water are not available, use hand sanitizer. Avoid touching your nose, eyes, and mouth, especially if you have not washed your hands recently. If anyone in your household has a viral infection, clean all household surfaces that may have been in contact with the virus. Use soap and hot water. You may also use a commercially prepared, bleach-containing solution. Stay away from people who are sick with symptoms of a viral infection. Do not share items such as toothbrushes and water bottles with other people. Keep your vaccinations up to date. This includes getting a yearly flu shot. Eat a healthy diet and get plenty of rest. Contact a health care provider if: You have symptoms of a viral illness that do not go away. Your symptoms come back after going away. Your symptoms get worse. Get help right away if: You have trouble breathing. You have a severe headache or a stiff neck. You have severe vomiting or pain in your abdomen. These symptoms may be an emergency. Get help right away. Call 911. Do not wait to see if the symptoms will go away. Do not drive yourself to the hospital. This information is not intended to replace advice given to you by your health  care provider. Make sure you discuss any questions you have with your health care provider. Document Revised: 04/04/2022 Document Reviewed: 01/17/2022 [employer]vier Patient Education  2024 [employer].

## 2024-04-14 NOTE — Assessment & Plan Note (Signed)
 Fever management discussed. Continue Tylenol and/or Advil as needed Advised to rest and stay well-hydrated

## 2024-04-14 NOTE — Assessment & Plan Note (Signed)
 Clinically stable running its course without complications. Negative COVID and flu tests. Symptom management discussed. Advised to rest and stay well-hydrated Recommend blood work and urinalysis today Advised to contact the office if no better or worse during the next several days

## 2024-04-14 NOTE — Progress Notes (Signed)
 Casey Sellers 49 y.o.   Chief Complaint  Patient presents with   Fever    HISTORY OF PRESENT ILLNESS: This is a 49 y.o. male complaining of acute onset of fever and chills with headaches and bodyaches that started 3 days ago Tested negative for COVID at home No other associated symptoms No other complaints or medical concerns today  Fever  Associated symptoms include headaches. Pertinent negatives include no abdominal pain, chest pain, congestion, coughing, diarrhea, nausea, rash, sore throat, urinary pain or vomiting.     Prior to Admission medications  Medication Sig Start Date End Date Taking? Authorizing Provider  ipratropium (ATROVENT ) 0.03 % nasal spray Place 2 sprays into both nostrils every 12 (twelve) hours. 01/01/24  Yes Moishe Chiquita HERO, NP  rosuvastatin  (CRESTOR ) 10 MG tablet Take 1 tablet (10 mg total) by mouth daily. 03/23/24  Yes Itay Mella Jose, MD  acetaminophen (TYLENOL) 500 MG tablet Take 500 mg by mouth every 6 (six) hours as needed. Patient not taking: Reported on 04/14/2024    [provider]  amLODipine  (NORVASC ) 5 MG tablet Take 1 tablet (5 mg total) by mouth daily. Patient not taking: Reported on 04/14/2024 05/10/22   Tauni Sanks Jose, MD  fluticasone  (FLONASE ) 50 MCG/ACT nasal spray Place 2 sprays into both nostrils daily. Patient not taking: Reported on 04/14/2024 03/07/22   Vivienne Delon HERO, PA-C    Allergies[1]  Patient Active Problem List   Diagnosis Date Noted   Essential hypertension 05/14/2022   Abnormal EKG 05/14/2022   Dyslipidemia 05/10/2022   Elevated LDL cholesterol level 07/24/2016   Tobacco use disorder 01/13/2016    Past Medical History:  Diagnosis Date   Abnormal EKG 05/14/2022   Allergic rhinitis 06/09/2017   Dyslipidemia 05/10/2022   Elevated LDL cholesterol level 07/24/2016   Essential hypertension 05/14/2022    Past Surgical History:  Procedure Laterality Date   KNEE SURGERY  04/02/1993    Cartiledge repair   MOUTH SURGERY N/A     Social History   Socioeconomic History   Marital status: Married    Spouse name: Not on file   Number of children: 3   Years of education: 12   Highest education level: 12th grade  Occupational History   Occupation: [employer]  Tobacco Use   Smoking status: Former    Current packs/day: 0.00    Average packs/day: 0.5 packs/day for 25.0 years (12.5 ttl pk-yrs)    Types: Cigarettes    Start date: 04/14/1992    Quit date: 04/14/2017    Years since quitting: 7.0   Smokeless tobacco: Never  Substance and Sexual Activity   Alcohol use: Yes    Alcohol/week: 2.0 standard drinks of alcohol    Types: 1 Cans of beer, 1 Shots of liquor per week    Comment: former   Drug use: No   Sexual activity: Yes    Partners: Female  Other Topics Concern   Not on file  Social History Narrative   Born and raised in [place], [place]. Currently resides in a house with his son, finace and step-son. Fish. Fun: hang out at the house.   Denies any religious beliefs that would effect health care.    Social Drivers of Health   Tobacco Use: Medium Risk (04/14/2024)   Patient History    Smoking Tobacco Use: Former    Smokeless Tobacco Use: Never    Passive Exposure: Not on file  Financial Resource Strain: Low Risk (05/19/2023)   Overall Financial Resource  Strain (CARDIA)    Difficulty of Paying Living Expenses: Not hard at all  Food Insecurity: No Food Insecurity (05/19/2023)   Hunger Vital Sign    Worried About Running Out of Food in the Last Year: Never true    Ran Out of Food in the Last Year: Never true  Transportation Needs: No Transportation Needs (05/19/2023)   PRAPARE - Administrator, Civil Service (Medical): No    Lack of Transportation (Non-Medical): No  Physical Activity: Sufficiently Active (05/19/2023)   Exercise Vital Sign    Days of Exercise per Week: 4 days    Minutes of Exercise per Session: 60 min  Stress: Stress Concern Present  (05/19/2023)   [employer] of Occupational Health - Occupational Stress Questionnaire    Feeling of Stress : To some extent  Social Connections: Moderately Integrated (05/19/2023)   Social Connection and Isolation Panel    Frequency of Communication with Friends and Family: More than three times a week    Frequency of Social Gatherings with Friends and Family: Once a week    Attends Religious Services: 1 to 4 times per year    Active Member of [employer] or Organizations: No    Attends Engineer, Structural: Not on file    Marital Status: Married  Catering Manager Violence: Not on file  Depression (PHQ2-9): Low Risk (07/08/2023)   Depression (PHQ2-9)    PHQ-2 Score: 0  Alcohol Screen: Not on file  Housing: Unknown (05/19/2023)   Housing Stability Vital Sign    Unable to Pay for Housing in the Last Year: No    Number of Times Moved in the Last Year: Not on file    Homeless in the Last Year: No  Utilities: Not on file  Health Literacy: Not on file    Family History  Problem Relation Age of Onset   Hypertension Mother    Diabetes Mother    High Cholesterol Mother    Arthritis Mother    Diabetes Father    High Cholesterol Father    Hypertension Father    Hypertension Sister    Hypertension Brother    Stroke Maternal Aunt      Review of Systems  Constitutional:  Positive for chills and fever.  HENT: Negative.  Negative for congestion and sore throat.   Respiratory: Negative.  Negative for cough and shortness of breath.   Cardiovascular: Negative.  Negative for chest pain and palpitations.  Gastrointestinal:  Negative for abdominal pain, diarrhea, nausea and vomiting.  Genitourinary: Negative.  Negative for dysuria and hematuria.  Musculoskeletal:  Positive for back pain (Left sided sciatica) and myalgias.  Skin: Negative.  Negative for rash.  Neurological:  Positive for headaches. Negative for dizziness.  All other systems reviewed and are negative.   Today's  Vitals   04/14/24 0818  BP: 113/75  Pulse: (!) 108  Temp: 99.3 F (37.4 C)  TempSrc: Oral  SpO2: 98%  Weight: 185 lb (83.9 kg)  Height: 5' 5 (1.651 m)   Body mass index is 30.79 kg/m.   Physical Exam Vitals reviewed.  Constitutional:      Appearance: Normal appearance.  HENT:     Head: Normocephalic.     Right Ear: Tympanic membrane, ear canal and external ear normal.     Left Ear: Tympanic membrane, ear canal and external ear normal.     Mouth/Throat:     Mouth: Mucous membranes are moist.     Pharynx: Oropharynx is clear.  Eyes:     Extraocular Movements: Extraocular movements intact.     Conjunctiva/sclera: Conjunctivae normal.     Pupils: Pupils are equal, round, and reactive to light.  Cardiovascular:     Rate and Rhythm: Normal rate and regular rhythm.     Pulses: Normal pulses.     Heart sounds: Normal heart sounds.  Pulmonary:     Effort: Pulmonary effort is normal.     Breath sounds: Normal breath sounds.  Abdominal:     Palpations: Abdomen is soft.     Tenderness: There is no abdominal tenderness.  Musculoskeletal:     Cervical back: No tenderness.  Lymphadenopathy:     Cervical: No cervical adenopathy.  Skin:    General: Skin is warm and dry.  Neurological:     Mental Status: He is alert and oriented to person, place, and time.  Psychiatric:        Mood and Affect: Mood normal.        Behavior: Behavior normal.    Results for orders placed or performed in visit on 04/14/24 (from the past 24 hours)  POC COVID-19     Status: Normal   Collection Time: 04/14/24  8:41 AM  Result Value Ref Range   SARS Coronavirus 2 Ag Negative Negative  POCT Influenza A/B     Status: Normal   Collection Time: 04/14/24  8:42 AM  Result Value Ref Range   Influenza A, POC Negative Negative   Influenza B, POC Negative Negative     ASSESSMENT & PLAN: Problem List Items Addressed This Visit       Other   Viral illness - Primary   Clinically stable running its  course without complications. Negative COVID and flu tests. Symptom management discussed. Advised to rest and stay well-hydrated Recommend blood work and urinalysis today Advised to contact the office if no better or worse during the next several days      Relevant Orders   Urinalysis   Comprehensive metabolic panel with GFR   CBC with Differential/Platelet   Fever   Fever management discussed. Continue Tylenol and/or Advil as needed Advised to rest and stay well-hydrated      Relevant Orders   POCT Influenza A/B (Completed)   POC COVID-19 (Completed)   Urinalysis   Comprehensive metabolic panel with GFR   CBC with Differential/Platelet   Patient Instructions  Viral Illness, Adult Viruses are tiny germs that can get into a person's body and cause illness. There are many different types of viruses. And they cause many types of illness. Viral illnesses can range from mild to severe. They can affect various parts of the body. Short-term conditions that are caused by a virus include colds and flu (influenza) and stomach viruses. Long-term conditions that are caused by a virus include herpes, shingles, and human immunodeficiency virus (HIV) infection. A few viruses have been linked to certain cancers. What are the causes? Many types of viruses can cause illness. Viruses get into cells in your body, multiply, and cause the infected cells to work differently or die. When these cells die, they release more of the virus. When this happens, you get symptoms of the illness and the virus spreads to other cells. If the virus takes over how the cell works, it can cause the cell to divide and grow out of control. This happens when a virus causes cancer. Different viruses get into the body in different ways. You can get a virus by: Swallowing food or water  that has come in contact with the virus. Breathing in droplets that have been coughed or sneezed into the air by an infected person. Touching a  surface that has the virus on it and then touching your eyes, nose, or mouth. Being bitten by an insect or animal that carries the virus. Having sexual contact with a person who is infected with the virus. Being exposed to blood or fluids that contain the virus, either through an open cut or during a transfusion. If a virus enters your body, your body's disease-fighting system (immune system) will try to fight the virus. You may be at higher risk for a viral illness if your immune system is weak. What are the signs or symptoms? Symptoms depend on the type of virus and the location of the cells that it gets into. Symptoms can include: For cold and flu viruses: Fever. Headache. Sore throat. Muscle aches. Stuffy nose (nasal congestion). Cough. For stomach (gastrointestinal) viruses: Fever. Pain in the abdomen. Nausea or vomiting. Diarrhea. For liver viruses (hepatitis): Loss of appetite. Feeling tired. Skin or the white parts of your eyes turning yellow (jaundice). For brain and spinal cord viruses: Fever. Headache. Stiff neck. Nausea and vomiting. Confusion or being sleepy. For skin viruses: Warts. Itching. Rash. For sexually transmitted viruses: Discharge. Swelling. Redness. Rash. How is this diagnosed? This condition may be diagnosed based on one or more of these: Your symptoms and medical history. A physical exam. Tests, such as: Blood tests. Tests on a sample of mucus from your lungs (sputum sample). Tests on a poop (stool) sample. Tests on a swab of body fluids or a skin sore (lesion). How is this treated? Viruses can be hard to treat because they live within cells. Antibiotics do not treat viruses because these medicines do not get inside cells. Treatment for a viral illness may include: Resting and drinking a lot of fluids. Medicines to treat symptoms. These can include over-the-counter medicine for pain and fever, medicines for cough or congestion, and  medicines for diarrhea. Antiviral medicines. These medicines are available only for certain types of viruses. Some viral illnesses can be prevented with vaccinations. A common example is the flu shot. Follow these instructions at home: Medicines Take over-the-counter and prescription medicines only as told by your health care provider. If you were prescribed an antiviral medicine, take it as told by your provider. Do not stop taking the antiviral even if you start to feel better. Know when antibiotics are needed and when they are not needed. Antibiotics do not treat viruses. You may get an antibiotic if your provider thinks that you may have, or are at risk for, a bacterial infection and you have a viral infection. Do not ask for an antibiotic prescription if you have been diagnosed with a viral illness. Antibiotics will not make your illness go away faster. Taking antibiotics when they are not needed can lead to antibiotic resistance. When this develops, the medicine no longer works against the bacteria that it normally fights. General instructions Drink enough fluids to keep your pee (urine) pale yellow. Rest as much as possible. Return to your normal activities as told by your provider. Ask your provider what activities are safe for you. How is this prevented? To lower your risk of getting another viral illness: Wash your hands often with soap and water for at least 20 seconds. If soap and water are not available, use hand sanitizer. Avoid touching your nose, eyes, and mouth, especially if you have not washed  your hands recently. If anyone in your household has a viral infection, clean all household surfaces that may have been in contact with the virus. Use soap and hot water. You may also use a commercially prepared, bleach-containing solution. Stay away from people who are sick with symptoms of a viral infection. Do not share items such as toothbrushes and water bottles with other  people. Keep your vaccinations up to date. This includes getting a yearly flu shot. Eat a healthy diet and get plenty of rest. Contact a health care provider if: You have symptoms of a viral illness that do not go away. Your symptoms come back after going away. Your symptoms get worse. Get help right away if: You have trouble breathing. You have a severe headache or a stiff neck. You have severe vomiting or pain in your abdomen. These symptoms may be an emergency. Get help right away. Call 911. Do not wait to see if the symptoms will go away. Do not drive yourself to the hospital. This information is not intended to replace advice given to you by your health care provider. Make sure you discuss any questions you have with your health care provider. Document Revised: 04/04/2022 Document Reviewed: 01/17/2022 Elsevier Patient Education  2024 [employer]     Emil Schaumann, MD [facility]    [1]  Allergies Allergen Reactions   Food     Golden raisins   Red Dye #40 (Allura Red)
